# Patient Record
Sex: Female | Born: 1970
Health system: Southern US, Community
[De-identification: ages and names within clinical notes are randomized; demographics above are authoritative.]

## PROBLEM LIST (undated history)

## (undated) DIAGNOSIS — M722 Plantar fascial fibromatosis: Secondary | ICD-10-CM

## (undated) DIAGNOSIS — G4700 Insomnia, unspecified: Secondary | ICD-10-CM

## (undated) DIAGNOSIS — F32A Depression, unspecified: Secondary | ICD-10-CM

## (undated) DIAGNOSIS — R51 Headache: Secondary | ICD-10-CM

## (undated) DIAGNOSIS — IMO0002 Reserved for concepts with insufficient information to code with codable children: Secondary | ICD-10-CM

## (undated) DIAGNOSIS — G43009 Migraine without aura, not intractable, without status migrainosus: Secondary | ICD-10-CM

## (undated) DIAGNOSIS — F329 Major depressive disorder, single episode, unspecified: Secondary | ICD-10-CM

## (undated) DIAGNOSIS — N3281 Overactive bladder: Secondary | ICD-10-CM

## (undated) DIAGNOSIS — M1990 Unspecified osteoarthritis, unspecified site: Secondary | ICD-10-CM

## (undated) DIAGNOSIS — N938 Other specified abnormal uterine and vaginal bleeding: Secondary | ICD-10-CM

## (undated) DIAGNOSIS — R011 Cardiac murmur, unspecified: Secondary | ICD-10-CM

## (undated) DIAGNOSIS — Z803 Family history of malignant neoplasm of breast: Secondary | ICD-10-CM

## (undated) HISTORY — DX: Unspecified osteoarthritis, unspecified site: M19.90

## (undated) HISTORY — DX: Reserved for concepts with insufficient information to code with codable children: IMO0002

## (undated) HISTORY — DX: Major depressive disorder, single episode, unspecified: F32.9

## (undated) HISTORY — DX: Plantar fascial fibromatosis: M72.2

## (undated) HISTORY — PX: LUMBAR DISC SURGERY: SHX700

## (undated) HISTORY — PX: CYSTOCELE REPAIR: SHX163

## (undated) HISTORY — DX: Cardiac murmur, unspecified: R01.1

## (undated) HISTORY — PX: ENDOMETRIAL ABLATION: SHX621

## (undated) HISTORY — DX: Migraine without aura, not intractable, without status migrainosus: G43.009

## (undated) HISTORY — PX: OTHER SURGICAL HISTORY: SHX169

## (undated) HISTORY — DX: Headache: R51

## (undated) HISTORY — DX: Other specified abnormal uterine and vaginal bleeding: N93.8

## (undated) HISTORY — DX: Depression, unspecified: F32.A

## (undated) HISTORY — DX: Insomnia, unspecified: G47.00

## (undated) HISTORY — PX: LEEP: SHX91

## (undated) HISTORY — DX: Overactive bladder: N32.81

## (undated) HISTORY — DX: Family history of malignant neoplasm of breast: Z80.3

---

## 1991-12-29 HISTORY — PX: LEEP: SHX91

## 1998-06-28 ENCOUNTER — Other Ambulatory Visit: Admission: RE | Admit: 1998-06-28 | Discharge: 1998-06-28 | Payer: Self-pay | Admitting: Obstetrics and Gynecology

## 1998-08-26 ENCOUNTER — Observation Stay (HOSPITAL_COMMUNITY): Admission: AD | Admit: 1998-08-26 | Discharge: 1998-08-26 | Payer: Self-pay | Admitting: Obstetrics and Gynecology

## 1999-01-31 ENCOUNTER — Inpatient Hospital Stay (HOSPITAL_COMMUNITY): Admission: AD | Admit: 1999-01-31 | Discharge: 1999-01-31 | Payer: Self-pay | Admitting: Obstetrics and Gynecology

## 1999-02-16 ENCOUNTER — Inpatient Hospital Stay (HOSPITAL_COMMUNITY): Admission: AD | Admit: 1999-02-16 | Discharge: 1999-02-16 | Payer: Self-pay | Admitting: Obstetrics and Gynecology

## 1999-03-03 ENCOUNTER — Inpatient Hospital Stay (HOSPITAL_COMMUNITY): Admission: AD | Admit: 1999-03-03 | Discharge: 1999-03-06 | Payer: Self-pay | Admitting: Obstetrics and Gynecology

## 1999-04-07 ENCOUNTER — Other Ambulatory Visit: Admission: RE | Admit: 1999-04-07 | Discharge: 1999-04-07 | Payer: Self-pay | Admitting: Obstetrics and Gynecology

## 2000-06-21 ENCOUNTER — Other Ambulatory Visit: Admission: RE | Admit: 2000-06-21 | Discharge: 2000-06-21 | Payer: Self-pay | Admitting: Obstetrics and Gynecology

## 2001-06-22 ENCOUNTER — Other Ambulatory Visit: Admission: RE | Admit: 2001-06-22 | Discharge: 2001-06-22 | Payer: Self-pay | Admitting: Obstetrics and Gynecology

## 2001-07-22 ENCOUNTER — Encounter: Admission: RE | Admit: 2001-07-22 | Discharge: 2001-07-22 | Payer: Self-pay | Admitting: Obstetrics and Gynecology

## 2001-07-22 ENCOUNTER — Encounter: Payer: Self-pay | Admitting: Obstetrics and Gynecology

## 2002-01-23 ENCOUNTER — Other Ambulatory Visit: Admission: RE | Admit: 2002-01-23 | Discharge: 2002-01-23 | Payer: Self-pay | Admitting: Obstetrics and Gynecology

## 2002-02-01 ENCOUNTER — Encounter: Payer: Self-pay | Admitting: Obstetrics and Gynecology

## 2002-02-01 ENCOUNTER — Inpatient Hospital Stay (HOSPITAL_COMMUNITY): Admission: AD | Admit: 2002-02-01 | Discharge: 2002-02-01 | Payer: Self-pay | Admitting: Obstetrics and Gynecology

## 2002-07-17 ENCOUNTER — Inpatient Hospital Stay (HOSPITAL_COMMUNITY): Admission: AD | Admit: 2002-07-17 | Discharge: 2002-07-17 | Payer: Self-pay | Admitting: Obstetrics and Gynecology

## 2002-08-16 ENCOUNTER — Inpatient Hospital Stay (HOSPITAL_COMMUNITY): Admission: AD | Admit: 2002-08-16 | Discharge: 2002-08-18 | Payer: Self-pay | Admitting: Obstetrics and Gynecology

## 2002-09-22 ENCOUNTER — Other Ambulatory Visit: Admission: RE | Admit: 2002-09-22 | Discharge: 2002-09-22 | Payer: Self-pay | Admitting: Obstetrics and Gynecology

## 2004-03-17 ENCOUNTER — Other Ambulatory Visit: Admission: RE | Admit: 2004-03-17 | Discharge: 2004-03-17 | Payer: Self-pay | Admitting: Obstetrics and Gynecology

## 2004-04-03 ENCOUNTER — Encounter: Admission: RE | Admit: 2004-04-03 | Discharge: 2004-04-03 | Payer: Self-pay | Admitting: Obstetrics and Gynecology

## 2004-12-18 ENCOUNTER — Ambulatory Visit: Payer: Self-pay | Admitting: Family Medicine

## 2004-12-28 HISTORY — PX: ABDOMINOPLASTY: SUR9

## 2005-04-27 ENCOUNTER — Encounter: Admission: RE | Admit: 2005-04-27 | Discharge: 2005-04-27 | Payer: Self-pay | Admitting: Obstetrics and Gynecology

## 2005-05-05 ENCOUNTER — Ambulatory Visit: Payer: Self-pay | Admitting: Family Medicine

## 2005-10-26 ENCOUNTER — Ambulatory Visit: Payer: Self-pay | Admitting: Internal Medicine

## 2005-10-27 ENCOUNTER — Encounter: Admission: RE | Admit: 2005-10-27 | Discharge: 2005-10-27 | Payer: Self-pay | Admitting: Internal Medicine

## 2005-11-11 ENCOUNTER — Encounter: Admission: RE | Admit: 2005-11-11 | Discharge: 2005-11-11 | Payer: Self-pay | Admitting: Family Medicine

## 2005-11-11 ENCOUNTER — Ambulatory Visit: Payer: Self-pay | Admitting: Family Medicine

## 2005-11-24 ENCOUNTER — Ambulatory Visit: Payer: Self-pay | Admitting: Family Medicine

## 2005-12-04 ENCOUNTER — Encounter: Admission: RE | Admit: 2005-12-04 | Discharge: 2005-12-04 | Payer: Self-pay | Admitting: Sports Medicine

## 2005-12-28 HISTORY — PX: LUMBAR DISC SURGERY: SHX700

## 2006-06-03 ENCOUNTER — Encounter: Admission: RE | Admit: 2006-06-03 | Discharge: 2006-06-03 | Payer: Self-pay | Admitting: Obstetrics and Gynecology

## 2006-10-29 ENCOUNTER — Encounter: Admission: RE | Admit: 2006-10-29 | Discharge: 2006-10-29 | Payer: Self-pay | Admitting: Sports Medicine

## 2006-11-19 ENCOUNTER — Encounter: Admission: RE | Admit: 2006-11-19 | Discharge: 2006-11-19 | Payer: Self-pay | Admitting: Sports Medicine

## 2006-11-29 ENCOUNTER — Ambulatory Visit (HOSPITAL_COMMUNITY): Admission: RE | Admit: 2006-11-29 | Discharge: 2006-11-29 | Payer: Self-pay | Admitting: Neurosurgery

## 2007-03-23 ENCOUNTER — Ambulatory Visit (HOSPITAL_COMMUNITY): Admission: RE | Admit: 2007-03-23 | Discharge: 2007-03-23 | Payer: Self-pay | Admitting: Neurosurgery

## 2007-08-31 ENCOUNTER — Encounter: Admission: RE | Admit: 2007-08-31 | Discharge: 2007-08-31 | Payer: Self-pay | Admitting: Obstetrics and Gynecology

## 2007-10-07 ENCOUNTER — Encounter: Payer: Self-pay | Admitting: Family Medicine

## 2007-11-17 ENCOUNTER — Ambulatory Visit: Payer: Self-pay | Admitting: Family Medicine

## 2007-11-17 DIAGNOSIS — Z8679 Personal history of other diseases of the circulatory system: Secondary | ICD-10-CM

## 2007-11-17 DIAGNOSIS — M791 Myalgia, unspecified site: Secondary | ICD-10-CM | POA: Insufficient documentation

## 2007-11-17 DIAGNOSIS — N3942 Incontinence without sensory awareness: Secondary | ICD-10-CM | POA: Insufficient documentation

## 2007-11-17 DIAGNOSIS — R51 Headache: Secondary | ICD-10-CM

## 2007-11-17 DIAGNOSIS — M1990 Unspecified osteoarthritis, unspecified site: Secondary | ICD-10-CM | POA: Insufficient documentation

## 2007-11-17 DIAGNOSIS — R5600 Simple febrile convulsions: Secondary | ICD-10-CM | POA: Insufficient documentation

## 2007-11-17 DIAGNOSIS — Z9189 Other specified personal risk factors, not elsewhere classified: Secondary | ICD-10-CM

## 2007-11-17 DIAGNOSIS — F329 Major depressive disorder, single episode, unspecified: Secondary | ICD-10-CM

## 2007-11-17 DIAGNOSIS — R519 Headache, unspecified: Secondary | ICD-10-CM | POA: Insufficient documentation

## 2007-11-17 DIAGNOSIS — IMO0001 Reserved for inherently not codable concepts without codable children: Secondary | ICD-10-CM

## 2007-12-07 ENCOUNTER — Ambulatory Visit: Payer: Self-pay | Admitting: Family Medicine

## 2007-12-07 LAB — CONVERTED CEMR LAB
Bilirubin Urine: NEGATIVE
Glucose, Urine, Semiquant: NEGATIVE
Protein, U semiquant: NEGATIVE
pH: 7.5

## 2007-12-13 LAB — CONVERTED CEMR LAB
Albumin: 3.8 g/dL (ref 3.5–5.2)
Alkaline Phosphatase: 67 units/L (ref 39–117)
BUN: 8 mg/dL (ref 6–23)
Basophils Absolute: 0 10*3/uL (ref 0.0–0.1)
Chloride: 100 meq/L (ref 96–112)
Cholesterol: 207 mg/dL (ref 0–200)
Creatinine, Ser: 0.8 mg/dL (ref 0.4–1.2)
GFR calc non Af Amer: 86 mL/min
HDL: 58.2 mg/dL (ref >39.0)
MCHC: 34.9 g/dL (ref 30.0–36.0)
Monocytes Absolute: 0.4 10*3/uL (ref 0.2–0.7)
Monocytes Relative: 5.9 % (ref 3.0–11.0)
Potassium: 4.4 meq/L (ref 3.5–5.1)
RBC: 4.93 M/uL (ref 3.87–5.11)
RDW: 13 % (ref 11.5–14.6)
TSH: 2.09 microintl units/mL (ref 0.35–5.50)
Total Bilirubin: 0.8 mg/dL (ref 0.3–1.2)
Total CHOL/HDL Ratio: 3.6
Triglycerides: 50 mg/dL (ref 0–149)

## 2007-12-14 ENCOUNTER — Ambulatory Visit: Payer: Self-pay | Admitting: Family Medicine

## 2008-02-06 ENCOUNTER — Telehealth: Payer: Self-pay | Admitting: Family Medicine

## 2008-07-20 ENCOUNTER — Telehealth: Payer: Self-pay | Admitting: Family Medicine

## 2008-09-05 ENCOUNTER — Encounter: Admission: RE | Admit: 2008-09-05 | Discharge: 2008-09-05 | Payer: Self-pay | Admitting: Obstetrics and Gynecology

## 2008-09-26 ENCOUNTER — Encounter: Payer: Self-pay | Admitting: Family Medicine

## 2008-10-05 ENCOUNTER — Ambulatory Visit (HOSPITAL_COMMUNITY): Admission: RE | Admit: 2008-10-05 | Discharge: 2008-10-05 | Payer: Self-pay | Admitting: Obstetrics and Gynecology

## 2008-10-05 ENCOUNTER — Encounter (INDEPENDENT_AMBULATORY_CARE_PROVIDER_SITE_OTHER): Payer: Self-pay | Admitting: Obstetrics and Gynecology

## 2008-12-28 HISTORY — PX: ENDOMETRIAL ABLATION: SHX621

## 2009-01-31 ENCOUNTER — Ambulatory Visit: Payer: Self-pay | Admitting: Family Medicine

## 2009-01-31 ENCOUNTER — Telehealth: Payer: Self-pay | Admitting: Family Medicine

## 2009-01-31 DIAGNOSIS — G4700 Insomnia, unspecified: Secondary | ICD-10-CM | POA: Insufficient documentation

## 2009-02-11 ENCOUNTER — Ambulatory Visit: Payer: Self-pay | Admitting: Family Medicine

## 2009-02-11 DIAGNOSIS — L03211 Cellulitis of face: Secondary | ICD-10-CM

## 2009-02-11 DIAGNOSIS — L0201 Cutaneous abscess of face: Secondary | ICD-10-CM

## 2009-02-12 ENCOUNTER — Encounter: Payer: Self-pay | Admitting: Family Medicine

## 2009-04-01 ENCOUNTER — Telehealth: Payer: Self-pay | Admitting: Family Medicine

## 2009-05-21 ENCOUNTER — Emergency Department (HOSPITAL_COMMUNITY): Admission: EM | Admit: 2009-05-21 | Discharge: 2009-05-21 | Payer: Self-pay | Admitting: Family Medicine

## 2009-09-30 ENCOUNTER — Encounter: Admission: RE | Admit: 2009-09-30 | Discharge: 2009-09-30 | Payer: Self-pay | Admitting: Obstetrics and Gynecology

## 2009-10-07 ENCOUNTER — Telehealth: Payer: Self-pay | Admitting: Family Medicine

## 2009-12-07 ENCOUNTER — Encounter (INDEPENDENT_AMBULATORY_CARE_PROVIDER_SITE_OTHER): Payer: Self-pay | Admitting: *Deleted

## 2009-12-10 ENCOUNTER — Ambulatory Visit: Payer: Self-pay | Admitting: Family Medicine

## 2009-12-10 DIAGNOSIS — N390 Urinary tract infection, site not specified: Secondary | ICD-10-CM | POA: Insufficient documentation

## 2009-12-10 LAB — CONVERTED CEMR LAB
Blood in Urine, dipstick: NEGATIVE
Nitrite: POSITIVE
Specific Gravity, Urine: <1.005
pH: 5

## 2009-12-11 ENCOUNTER — Encounter: Payer: Self-pay | Admitting: Family Medicine

## 2009-12-26 ENCOUNTER — Telehealth: Payer: Self-pay | Admitting: Family Medicine

## 2010-02-04 ENCOUNTER — Ambulatory Visit: Payer: Self-pay | Admitting: Family Medicine

## 2010-02-04 DIAGNOSIS — N938 Other specified abnormal uterine and vaginal bleeding: Secondary | ICD-10-CM | POA: Insufficient documentation

## 2010-02-04 DIAGNOSIS — N949 Unspecified condition associated with female genital organs and menstrual cycle: Secondary | ICD-10-CM

## 2010-02-04 LAB — CONVERTED CEMR LAB
Bilirubin Urine: NEGATIVE
Ketones, urine, test strip: NEGATIVE
Specific Gravity, Urine: <1.005
Urobilinogen, UA: 1

## 2010-02-05 ENCOUNTER — Encounter: Payer: Self-pay | Admitting: Family Medicine

## 2010-03-24 ENCOUNTER — Telehealth: Payer: Self-pay | Admitting: Family Medicine

## 2010-07-02 ENCOUNTER — Telehealth: Payer: Self-pay | Admitting: Family Medicine

## 2010-08-21 ENCOUNTER — Ambulatory Visit: Payer: Self-pay | Admitting: Family Medicine

## 2010-08-21 DIAGNOSIS — E669 Obesity, unspecified: Secondary | ICD-10-CM | POA: Insufficient documentation

## 2010-08-29 ENCOUNTER — Telehealth (INDEPENDENT_AMBULATORY_CARE_PROVIDER_SITE_OTHER): Payer: Self-pay

## 2010-10-21 ENCOUNTER — Encounter: Admission: RE | Admit: 2010-10-21 | Discharge: 2010-10-21 | Payer: Self-pay | Admitting: Obstetrics and Gynecology

## 2010-10-27 ENCOUNTER — Telehealth: Payer: Self-pay | Admitting: Family Medicine

## 2010-12-18 ENCOUNTER — Telehealth: Payer: Self-pay | Admitting: Internal Medicine

## 2010-12-18 ENCOUNTER — Encounter: Payer: Self-pay | Admitting: Family Medicine

## 2010-12-19 ENCOUNTER — Telehealth: Payer: Self-pay | Admitting: Family Medicine

## 2011-01-18 ENCOUNTER — Encounter: Payer: Self-pay | Admitting: Neurosurgery

## 2011-01-19 ENCOUNTER — Ambulatory Visit
Admission: RE | Admit: 2011-01-19 | Discharge: 2011-01-19 | Payer: Self-pay | Source: Home / Self Care | Attending: Family Medicine | Admitting: Family Medicine

## 2011-01-19 DIAGNOSIS — J069 Acute upper respiratory infection, unspecified: Secondary | ICD-10-CM | POA: Insufficient documentation

## 2011-01-19 DIAGNOSIS — K140 Glossitis: Secondary | ICD-10-CM | POA: Insufficient documentation

## 2011-01-27 NOTE — Progress Notes (Signed)
Summary: [facility] [place]. Pt req refill of Tramadol  Phone Note From Pharmacy Call back at [phone]  [facility]   Caller: [facility]   [place] [place] Summary of Call: Pt req refill of Tramadol 50mg  #120 to be sent to [facility] on [address]  fax# [phone]   Initial call taken by: Lucy Antigua,  July 02, 2010 11:10 AM    Prescriptions: TRAMADOL HCL 50 MG  TABS (TRAMADOL HCL) 1 or 2 tabs every 6 hours as needed pain  #120 Each x 0   Entered by:   Raechel Ache, RN   Authorized by:   Nelwyn Salisbury MD   Signed by:   Raechel Ache, RN on 07/02/2010   Method used:   Electronically to        [facility] [address] (retail)       [address]       [place], [place]  [address]       Ph: [phone]       Fax: [phone]   RxID:   [id number]

## 2011-01-27 NOTE — Assessment & Plan Note (Signed)
Summary: UTI? // RS   Vital Signs:  Patient profile:   40 year old female Weight:      188 pounds Temp:     98.2 degrees F oral Pulse rate:   89 / minute BP sitting:   110 / 80  Vitals Entered By: Lynann Beaver CMA (February 04, 2010 3:22 PM) CC: uti? Is Patient Diabetic? No Pain Assessment Patient in pain? yes     Location: abdomen   History of Present Illness: Here with another UTI, and she asks advice. She has a hx of frequent UTIs. Now for 2 days sha has the typical burning on urination and urgency to urinate. No fevers. Also, she has a long hx of dysfunctional uterine bleeding, and she has seen Dr. Billy Coast for this for years. Nothing thye have done has helped. this includes taking BCP, Provera cycles, wearing an IUD, wearing a NUVA ring, and even a laser endometrial ablation. She still has constant pelvic cramps with very unpredictable bleeding. She has to wear a pad of some sort most every day. She wants to know what else to do. She says she is through having children, and is open to the idea of having a hysterectomy. However Dr. Billy Coast does not seem to favor this.   Current Medications (verified): 1)  Glucosamine-Chondroitin 250-200 Mg  Caps (Glucosamine-Chondroitin) .Marland Kitchen.. 1 By Mouth Two Times A Day 2)  Vitamin D 400 Unit  Tabs (Cholecalciferol) .Marland Kitchen.. 1 By Mouth Once Daily 3)  Cymbalta 60 Mg  Cpep (Duloxetine Hcl) .... Once Daily 4)  Tramadol Hcl 50 Mg  Tabs (Tramadol Hcl) .Marland Kitchen.. 1 or 2 Tabs Every 6 Hours As Needed Pain 5)  Vesicare 10 Mg  Tabs (Solifenacin Succinate) .... Once Daily 6)  Ambien 10 Mg Tabs (Zolpidem Tartrate) .Marland Kitchen.. 1 By Mouth At Bedtime 7)  Bactroban 2 % Oint (Mupirocin) .... Apply 4 Times A Day As Needed 8)  Fluconazole 150 Mg Tabs (Fluconazole) .... As Needed 9)  Polytrim 10000-0.1 Unit/ml-% Soln (Polymyxin B-Trimethoprim) .... 2 Drops Qid Each Eye  Allergies (verified): 1)  ! Phenergan  Past History:  Past Medical History: Plantar  Fasciitis Osteoarthritis Headache heart murmur herniated lumbar discs, per Dr. Phoebe Perch Depression overactive bladder, sees Dr. Retta Diones insomnia sees Dr. Olivia Mackie for GYN exams dysfunctional uterine bleeding  Past Surgical History: LEEP procedure 1993 Lumbar discectomy  per Dr. Phoebe Perch 12-07 endometrial ablation per Dr. Billy Coast 2010  Review of Systems  The patient denies anorexia, fever, weight loss, weight gain, vision loss, decreased hearing, hoarseness, chest pain, syncope, dyspnea on exertion, peripheral edema, prolonged cough, headaches, hemoptysis, melena, hematochezia, severe indigestion/heartburn, hematuria, incontinence, genital sores, muscle weakness, suspicious skin lesions, transient blindness, difficulty walking, depression, unusual weight change, abnormal bleeding, enlarged lymph nodes, angioedema, breast masses, and testicular masses.    Physical Exam  General:  Well-developed,well-nourished,in no acute distress; alert,appropriate and cooperative throughout examination Abdomen:  soft, normal bowel sounds, no distention, no masses, no guarding, no rigidity, no rebound tenderness, and no abdominal hernia.  Mild bilateral lower quadrant tenderness.    Impression & Recommendations:  Problem # 1:  UTI (ICD-599.0)  Her updated medication list for this problem includes:    Vesicare 10 Mg Tabs (Solifenacin succinate) ..... Once daily    Ciprofloxacin Hcl 500 Mg Tabs (Ciprofloxacin hcl) .Marland Kitchen..Marland Kitchen Two times a day  Orders: UA Dipstick w/o Micro (automated)  (81003) T-Culture, Urine ([id number])  Problem # 2:  DYSFUNCTIONAL UTERINE BLEEDING (ICD-626.8)  Orders: Gynecologic Referral (Gyn)  Complete  Medication List: 1)  Glucosamine-chondroitin 250-200 Mg Caps (Glucosamine-chondroitin) .Marland Kitchen.. 1 by mouth two times a day 2)  Vitamin D 400 Unit Tabs (Cholecalciferol) .Marland Kitchen.. 1 by mouth once daily 3)  Cymbalta 60 Mg Cpep (Duloxetine hcl) .... Once daily 4)  Tramadol Hcl 50 Mg  Tabs (Tramadol hcl) .Marland Kitchen.. 1 or 2 tabs every 6 hours as needed pain 5)  Vesicare 10 Mg Tabs (Solifenacin succinate) .... Once daily 6)  Ambien 10 Mg Tabs (Zolpidem tartrate) .Marland Kitchen.. 1 by mouth at bedtime 7)  Bactroban 2 % Oint (Mupirocin) .... Apply 4 times a day as needed 8)  Fluconazole 150 Mg Tabs (Fluconazole) .... As needed 9)  Polytrim 10000-0.1 Unit/ml-% Soln (Polymyxin b-trimethoprim) .... 2 drops qid each eye 10)  Ciprofloxacin Hcl 500 Mg Tabs (Ciprofloxacin hcl) .... Two times a day 11)  Valtrex 500 Mg Tabs (Valacyclovir hcl) .... Two times a day as needed  Patient Instructions: 1)  use Cipro for the UTI, and await the culture results. It seems to me that a hysterectomy is the only remaining option for her to deal with the uterine bleeding, and she is willing to pursue this. We will refer her to Dr. Waynard Reeds for a second opinion on this matter.  Prescriptions: VALTREX 500 MG TABS (VALACYCLOVIR HCL) two times a day as needed  #60 x 11   Entered and Authorized by:   Nelwyn Salisbury MD   Signed by:   Nelwyn Salisbury MD on 02/04/2010   Method used:   Electronically to        [facility] [address] [phone]* (retail)       [address]       [place], [place]  [address]       Ph: [phone] or [phone]       Fax: [phone]   RxID:   [id number] CIPROFLOXACIN HCL 500 MG TABS (CIPROFLOXACIN HCL) two times a day  #20 x 0   Entered and Authorized by:   Nelwyn Salisbury MD   Signed by:   Nelwyn Salisbury MD on 02/04/2010   Method used:   Electronically to        [facility] [address] [phone]* (retail)       [address]       [place], [place]  [address]       Ph: [phone] or [phone]       Fax: [phone]   RxID:   [id number]   Laboratory Results   Urine Tests    Routine Urinalysis   Color: orange Appearance: Clear Glucose: trace   (Normal Range: Negative) Bilirubin: negative   (Normal Range: Negative) Ketone: negative    (Normal Range: Negative) Spec. Gravity: <1.005   (Normal Range: 1.003-1.035) Blood: 2+   (Normal Range: Negative) pH: 5.0   (Normal Range: 5.0-8.0) Protein: trace   (Normal Range: Negative) Urobilinogen: 1.0   (Normal Range: 0-1) Nitrite: positive   (Normal Range: Negative) Leukocyte Esterace: trace   (Normal Range: Negative)    Comments: Rita Ohara  February 04, 2010 4:30 PM

## 2011-01-27 NOTE — Assessment & Plan Note (Signed)
Summary: fup on meds//ccm   Vital Signs:  Patient profile:   40 year old female Weight:      188 pounds BMI:     30.00 BP sitting:   126 / 84  (left arm) Cuff size:   regular  Vitals Entered By: Raechel Ache, RN (August 21, 2010 11:03 AM) CC: Med check.   History of Present Illness: Here for med refills and to ask for assistance with losing weight. She is trying to exercise, but she cannot control her appetite. She has used Phentermine in the past with success. She uses Tramadol to control her arthritic pains.   Allergies: 1)  ! Phenergan  Past History:  Past Medical History: Reviewed history from 02/04/2010 and no changes required. Plantar Fasciitis Osteoarthritis Headache heart murmur herniated lumbar discs, per Dr. Phoebe Perch Depression overactive bladder, sees Dr. Retta Diones insomnia sees Dr. Olivia Mackie for GYN exams dysfunctional uterine bleeding  Past Surgical History: Reviewed history from 02/04/2010 and no changes required. LEEP procedure 1993 Lumbar discectomy  per Dr. Phoebe Perch 12-07 endometrial ablation per Dr. Billy Coast 2010  Review of Systems  The patient denies anorexia, fever, weight loss, vision loss, decreased hearing, hoarseness, chest pain, syncope, dyspnea on exertion, peripheral edema, prolonged cough, headaches, hemoptysis, abdominal pain, melena, hematochezia, severe indigestion/heartburn, hematuria, incontinence, genital sores, muscle weakness, suspicious skin lesions, transient blindness, difficulty walking, depression, unusual weight change, abnormal bleeding, enlarged lymph nodes, angioedema, breast masses, and testicular masses.    Physical Exam  General:  overweight-appearing.   Neck:  No deformities, masses, or tenderness noted. Lungs:  Normal respiratory effort, chest expands symmetrically. Lungs are clear to auscultation, no crackles or wheezes. Heart:  Normal rate and regular rhythm. S1 and S2 normal without gallop, murmur, click, rub or  other extra sounds.   Impression & Recommendations:  Problem # 1:  DEPRESSION, CHRONIC (ICD-311)  Her updated medication list for this problem includes:    Cymbalta 60 Mg Cpep (Duloxetine hcl) ..... Once daily  Problem # 2:  MYALGIA (ICD-729.1)  Her updated medication list for this problem includes:    Tramadol Hcl 50 Mg Tabs (Tramadol hcl) .Marland Kitchen... 1 or 2 tabs every 6 hours as needed pain  Problem # 3:  HEADACHE (ICD-784.0)  Her updated medication list for this problem includes:    Tramadol Hcl 50 Mg Tabs (Tramadol hcl) .Marland Kitchen... 1 or 2 tabs every 6 hours as needed pain  Problem # 4:  OSTEOARTHRITIS (ICD-715.90)  Her updated medication list for this problem includes:    Tramadol Hcl 50 Mg Tabs (Tramadol hcl) .Marland Kitchen... 1 or 2 tabs every 6 hours as needed pain  Problem # 5:  OBESITY (ICD-278.00)  Complete Medication List: 1)  Glucosamine-chondroitin 250-200 Mg Caps (Glucosamine-chondroitin) .Marland Kitchen.. 1 by mouth two times a day 2)  Vitamin D 400 Unit Tabs (Cholecalciferol) .Marland Kitchen.. 1 by mouth once daily 3)  Cymbalta 60 Mg Cpep (Duloxetine hcl) .... Once daily 4)  Tramadol Hcl 50 Mg Tabs (Tramadol hcl) .Marland Kitchen.. 1 or 2 tabs every 6 hours as needed pain 5)  Vesicare 10 Mg Tabs (Solifenacin succinate) .... Once daily 6)  Ambien 10 Mg Tabs (Zolpidem tartrate) .Marland Kitchen.. 1 by mouth at bedtime 7)  Bactroban 2 % Oint (Mupirocin) .... Apply 4 times a day as needed 8)  Fluconazole 150 Mg Tabs (Fluconazole) .... As needed 9)  Polytrim 10000-0.1 Unit/ml-% Soln (Polymyxin b-trimethoprim) .... 2 drops qid each eye 10)  Valtrex 500 Mg Tabs (Valacyclovir hcl) .... Two times a day  as needed 11)  Phentermine Hcl 37.5 Mg Caps (Phentermine hcl) .... Once daily  Patient Instructions: 1)  Try Phentermine. refilled meds.  2)  Please schedule a follow-up appointment in 6 months .  Prescriptions: PHENTERMINE HCL 37.5 MG CAPS (PHENTERMINE HCL) once daily  #30 x 5   Entered and Authorized by:   Nelwyn Salisbury MD   Signed by:    Nelwyn Salisbury MD on 08/21/2010   Method used:   Print then Give to Patient   RxID:   [id number] TRAMADOL HCL 50 MG  TABS (TRAMADOL HCL) 1 or 2 tabs every 6 hours as needed pain  #120 x 5   Entered and Authorized by:   Nelwyn Salisbury MD   Signed by:   Nelwyn Salisbury MD on 08/21/2010   Method used:   Print then Give to Patient   RxID:   [id number]

## 2011-01-27 NOTE — Progress Notes (Signed)
Summary: ANTIBIOTIC  Phone Note Call from Patient Call back at [phone]   Caller: vm at 4:44 Summary of Call: Requesting antibiotic CIPRO for UTI SYMPTOMS  to [facility] [phone]  nkda.   Initial call taken by: Rudy Jew, RN,  October 27, 2010 4:53 PM  Follow-up for Phone Call        call in Cipro 500 mg two times a day for 7 days  Follow-up by: Nelwyn Salisbury MD,  October 28, 2010 8:31 AM  Additional Follow-up for Phone Call Additional follow up Details #1::        pt aware.  Additional Follow-up by: Pura Spice, RN,  October 28, 2010 8:37 AM    New/Updated Medications: CIPRO 500 MG TABS (CIPROFLOXACIN HCL) 1 by mouth two times a day for 7 days Prescriptions: CIPRO 500 MG TABS (CIPROFLOXACIN HCL) 1 by mouth two times a day for 7 days  #14 x 0   Entered by:   Pura Spice, RN   Authorized by:   Nelwyn Salisbury MD   Signed by:   Pura Spice, RN on 10/28/2010   Method used:   Electronically to        [facility] [address] [phone]* (retail)       [address]       [place], [place]  [address]       Ph: [phone] or [phone]       Fax: [phone]   RxID:   [id number]

## 2011-01-27 NOTE — Progress Notes (Signed)
Summary: Pt req to get a copy of immunizations record  Phone Note Call from Patient Call back at [phone] cell   Caller: Patient Summary of Call: Pt called and has started a new job. Pt is needing to get a copy of her immunizations record. Pls call pt when ready for pick up.  Pt needs this asap.  Initial call taken by: Lucy Antigua,  August 29, 2010 8:07 AM  Follow-up for Phone Call        called. Follow-up by: Raechel Ache, RN,  August 29, 2010 11:18 AM

## 2011-01-27 NOTE — Progress Notes (Signed)
Summary: REQ FOR REFILL ON MED  Phone Note Call from Patient   Caller: Patient  [phone] Reason for Call: Refill Medication Summary of Call: Pt called to adv that she needs a refill on med: VESICARE 10MG  .... Pt adv that Rx can be sent to [facility] on [address]..... Pt can be reached at [phone] with any questions or concerns.  Initial call taken by: Debbra Riding,  March 24, 2010 10:08 AM  Follow-up for Phone Call        call in #30 with 11 rf Follow-up by: Nelwyn Salisbury MD,  March 24, 2010 3:05 PM    Prescriptions: VESICARE 10 MG  TABS (SOLIFENACIN SUCCINATE) once daily  #30.0 Each x 11   Entered by:   Raechel Ache, RN   Authorized by:   Nelwyn Salisbury MD   Signed by:   Raechel Ache, RN on 03/24/2010   Method used:   Electronically to        [facility]. [address] [phone]* (retail)       [address]       [place], [place]  [address]       Ph: [phone] or [phone]       Fax: [phone]   RxID:   [id number]

## 2011-01-29 NOTE — Progress Notes (Signed)
Summary: Pt has uti. Pt req uti called in to [facility] on [address]  Phone Note Call from Patient Call back at [phone]   Caller: Patient Summary of Call: Pt called and says she has a uti. Pt req antibiotic to be called in to [facility] on [address] [phone]. Pls call pt and notify her when this has been called in today.   Initial call taken by: Lucy Antigua,  December 18, 2010 1:23 PM  Follow-up for Phone Call        generic cipro 500 #14 one BID Follow-up by: Gordy Savers  MD,  December 18, 2010 5:12 PM    New/Updated Medications: CIPRO 500 MG TABS (CIPROFLOXACIN HCL) 1 by mouth two times a day for 7 days Prescriptions: CIPRO 500 MG TABS (CIPROFLOXACIN HCL) 1 by mouth two times a day for 7 days  #14 x 0   Entered by:   Lynann Beaver CMA AAMA   Authorized by:   Gordy Savers  MD   Signed by:   Lynann Beaver CMA AAMA on 12/19/2010   Method used:   Electronically to        [facility] (retail)       [address]       [place], [place]  [address]       Ph: [phone]       Fax: [phone]   RxID:   [id number]  Pt. notified.

## 2011-01-29 NOTE — Progress Notes (Signed)
  Phone Note Call from Patient   Summary of Call: cipro sent to wrong pharmacy Initial call taken by: Willy Eddy, LPN,  December 19, 2010 2:35 PM    Prescriptions: CIPRO 500 MG TABS (CIPROFLOXACIN HCL) 1 by mouth two times a day for 7 days  #14 x 0   Entered by:   Willy Eddy, LPN   Authorized by:   Nelwyn Salisbury MD   Signed by:   Willy Eddy, LPN on 04/54/0981   Method used:   Electronically to        [facility] [address] [phone]* (retail)       [address]       [place], [place]  [address]       Ph: [phone] or [phone]       Fax: [phone]   RxID:   [id number]

## 2011-01-29 NOTE — Letter (Signed)
Summary: [facility]  [facility]   Imported By: Maryln Gottron 12/31/2010 13:50:03  _____________________________________________________________________  External Attachment:    Type:   Image     Comment:   External Document

## 2011-01-29 NOTE — Assessment & Plan Note (Signed)
Summary: 2 CYSTS ON TONGUE//SLM   Vital Signs:  Patient profile:   40 year old female Weight:      187 pounds O2 Sat:      96 % Temp:     98.3 degrees F BP sitting:   100 / 60  (left arm)  Vitals Entered By: Pura Spice, RN (January 19, 2011 10:21 AM) CC: c/o lumps on tongue refill phetermine and tramadol. wants something for migraines. , Back Pain   History of Present Illness: Here for several reasons. First one week ago she developed some URI symptoms including a stuffy head, PND, ST, and tender swellings along both edges of her tongue. No cough or fever. Now these symptoms have resolved except for the tongue swellings, and these are much smaller than before. Second, she needs med refills. Her migrianes have been more frequent over the past few months, and she is interested in trying Initrex. Apparently she has never tried a triptan before.    Allergies: 1)  ! Phenergan  Past History:  Past Medical History: Reviewed history from 02/04/2010 and no changes required. Plantar Fasciitis Osteoarthritis Headache heart murmur herniated lumbar discs, per Dr. Phoebe Perch Depression overactive bladder, sees Dr. Retta Diones insomnia sees Dr. Olivia Mackie for GYN exams dysfunctional uterine bleeding  Review of Systems  The patient denies anorexia, fever, weight loss, weight gain, vision loss, decreased hearing, hoarseness, chest pain, syncope, dyspnea on exertion, peripheral edema, prolonged cough, hemoptysis, abdominal pain, melena, hematochezia, severe indigestion/heartburn, hematuria, incontinence, genital sores, muscle weakness, suspicious skin lesions, transient blindness, difficulty walking, depression, unusual weight change, abnormal bleeding, enlarged lymph nodes, angioedema, breast masses, and testicular masses.    Physical Exam  General:  Well-developed,well-nourished,in no acute distress; alert,appropriate and cooperative throughout examination Head:  Normocephalic and  atraumatic without obvious abnormalities. No apparent alopecia or balding. Eyes:  No corneal or conjunctival inflammation noted. EOMI. Perrla. Funduscopic exam benign, without hemorrhages, exudates or papilledema. Vision grossly normal. Ears:  External ear exam shows no significant lesions or deformities.  Otoscopic examination reveals clear canals, tympanic membranes are intact bilaterally without bulging, retraction, inflammation or discharge. Hearing is grossly normal bilaterally. Nose:  External nasal examination shows no deformity or inflammation. Nasal mucosa are pink and moist without lesions or exudates. Mouth:  Oral mucosa and oropharynx without lesions or exudates.  Teeth in good repair. Neck:  No deformities, masses, or tenderness noted. Lungs:  Normal respiratory effort, chest expands symmetrically. Lungs are clear to auscultation, no crackles or wheezes. Neurologic:  No cranial nerve deficits noted. Station and gait are normal. Plantar reflexes are down-going bilaterally. DTRs are symmetrical throughout. Sensory, motor and coordinative functions appear intact.   Impression & Recommendations:  Problem # 1:  VIRAL URI (ICD-465.9)  Problem # 2:  GLOSSITIS (ICD-529.0)  Problem # 3:  HEADACHE (ICD-784.0)  Her updated medication list for this problem includes:    Tramadol Hcl 50 Mg Tabs (Tramadol hcl) .Marland Kitchen... 1 or 2 tabs every 6 hours as needed pain    Sumatriptan Succinate 100 Mg Tabs (Sumatriptan succinate) .Marland Kitchen... As needed for ha  Complete Medication List: 1)  Glucosamine-chondroitin 250-200 Mg Caps (Glucosamine-chondroitin) .Marland Kitchen.. 1 by mouth two times a day 2)  Vitamin D 400 Unit Tabs (Cholecalciferol) .Marland Kitchen.. 1 by mouth once daily 3)  Cymbalta 60 Mg Cpep (Duloxetine hcl) .... Once daily 4)  Tramadol Hcl 50 Mg Tabs (Tramadol hcl) .Marland Kitchen.. 1 or 2 tabs every 6 hours as needed pain 5)  Vesicare 10 Mg Tabs (Solifenacin  succinate) .... Once daily 6)  Ambien 10 Mg Tabs (Zolpidem tartrate) .Marland Kitchen.. 1  by mouth at bedtime 7)  Bactroban 2 % Oint (Mupirocin) .... Apply 4 times a day as needed 8)  Fluconazole 150 Mg Tabs (Fluconazole) .... As needed 9)  Polytrim 10000-0.1 Unit/ml-% Soln (Polymyxin b-trimethoprim) .... 2 drops qid each eye 10)  Valtrex 500 Mg Tabs (Valacyclovir hcl) .... Two times a day as needed 11)  Phentermine Hcl 37.5 Mg Caps (Phentermine hcl) .... Once daily 12)  Sumatriptan Succinate 100 Mg Tabs (Sumatriptan succinate) .... As needed for ha  Patient Instructions: 1)  the tongue irritation seems to be part of the viral infection, and all of this is resolving. No treatment needed today. Try Imitrex for the HAs.  Prescriptions: SUMATRIPTAN SUCCINATE 100 MG TABS (SUMATRIPTAN SUCCINATE) as needed for HA  #10 x 11   Entered and Authorized by:   Nelwyn Salisbury MD   Signed by:   Nelwyn Salisbury MD on 01/19/2011   Method used:   Print then Give to Patient   RxID:   [id number] TRAMADOL HCL 50 MG  TABS (TRAMADOL HCL) 1 or 2 tabs every 6 hours as needed pain  #120 x 5   Entered and Authorized by:   Nelwyn Salisbury MD   Signed by:   Nelwyn Salisbury MD on 01/19/2011   Method used:   Print then Give to Patient   RxID:   [id number]    Orders Added: 1)  Est. Patient Level IV [27253]

## 2011-02-26 ENCOUNTER — Other Ambulatory Visit: Payer: Self-pay | Admitting: Family Medicine

## 2011-04-28 ENCOUNTER — Other Ambulatory Visit: Payer: Self-pay | Admitting: Surgery

## 2011-05-12 NOTE — Op Note (Signed)
Stacy Hays, Stacy Hays                ACCOUNT NO.:  [id number]   MEDICAL RECORD NO.:  [id number]          PATIENT TYPE:  AMB   LOCATION:  SDC                           FACILITY:  [facility]   PHYSICIAN:  Bertram Millard. Dahlstedt, M.D.DATE OF BIRTH:  06/23/1971   DATE OF PROCEDURE:  10/05/2008  DATE OF DISCHARGE:                               OPERATIVE REPORT   PREOPERATIVE DIAGNOSIS:  Stress urinary incontinence.   POSTOPERATIVE DIAGNOSIS:  Stress urinary incontinence.   PROCEDURE:  Lynx retropubic sling.   SURGEON:  Bertram Millard. Dahlstedt, MD   ANESTHESIA:  General with LMA.   COMPLICATIONS:  None.   BRIEF HISTORY:  This 40 year old female was sent recently by Dr. Billy Coast  for consideration of an anti-incontinence procedure.  She is having a  thermal ablation of her endometrium, and has a long history of stress  urinary incontinence.  In questioning her, she has mostly stress  incontinence with some mild remaining overactive bladder symptoms,  managed on Vesicare.  She desires surgical management of her stress  incontinence.  Risks and complications, as well as alternatives of the  surgery were discussed with the patient.  She understands these and  desires to proceed.   DESCRIPTION OF PROCEDURE:  The patient was administered preoperative  antibiotics, and taken to the operating room where Dr. Billy Coast, after  induction of anesthetic, performed endometrial ablation and removal of  an IUD.  The patient had been left in the dorsal lithotomy position, and  had already been prepped and draped.  At this point, a time-out was  called for the sling procedure.  Following this, 2 puncture sites were  made in the suprapubic area just above the pubis on either side of the  midline.  Additionally, her anterior vaginal wall was infiltrated with  10 mL of 1% lidocaine with epinephrine.  Her bladder was catheterized  and drained.  A midline incision was then made along the length of most  of the urethra,  and lateral dissection carried out to raise the vaginal  flaps to the pubocervical fascia bilaterally.  The retropubic needles  were then passed through the respective incisions in the suprapubic  area, guided down through the space of Retzius and exited the body on  either side of the urethra through the vaginal incision.  Following  this, careful inspection of the bladder was performed with the  cystoscope.  No bladder injuries were seen.  The [place] sling was then  grasped and brought through the retropubic space, cradling the urethra.  Approximately, one-half to one cm of slack was left on the sling,  encompassing the urethra.  Once adequate positioning was seen, the  sheath was removed.  The sling was trimmed underneath the incisions  bilaterally in the suprapubic area and pushed down into the subcutaneous  tissues.  Dermabond was used to close these incisions.  The vaginal  incision was then closed with a running 2-0 Vicryl in a simple fashion.  There was no significant bleeding.  The catheter was used to drain all, but about 100-150 mL of the water  out of  the bladder.  The left remained indwelling for a voiding trial  later.   The patient tolerated the procedure well.  She was then awakened and  taken to PACU in stable condition.      Bertram Millard. Dahlstedt, M.D.  Electronically Signed     SMD/MEDQ  D:  10/05/2008  T:  10/05/2008  Job:  [id number]   cc:   Lenoard Aden, M.D.  Fax: [phone]

## 2011-05-12 NOTE — Op Note (Signed)
Stacy Hays, Stacy Hays                ACCOUNT NO.:  [id number]   MEDICAL RECORD NO.:  [id number]          PATIENT TYPE:  AMB   LOCATION:  SDC                           [facility]ILITY[facility]  [facility]   PHYSICIAN:  Lenoard Aden, M.D.DATE OF BIRTH:  Dec 09, 1971   DATE OF PROCEDURE:  10/05/2008  DATE OF DISCHARGE:                               OPERATIVE REPORT   PREOPERATIVE DIAGNOSES:  Menorrhagia, contraceptive management.   POSTOPERATIVE DIAGNOSES:  Menorrhagia, contraceptive management.   PROCEDURES:  Diagnostic hysteroscopy, dilation and curettage, NovaSure  endometrial ablation, Merina intrauterine device removal.   SURGEON:  Lenoard Aden, MD   ANESTHESIA:  General.   BLOOD LOSS:  Less than 50 mL.   COMPLICATIONS:  None.   DRAINS:  None.   COUNTS:  Correct.   The patient prepared for a urologic sling procedure per Dr. Retta Diones to  be dictated separately.   BRIEF OPERATIVE NOTE:  After being apprised of risks of anesthesia,  infection, bleeding, injury to abdominal organs, need for repair,  delayed versus immediate complications due to uterine perforation with  possible need for repair.  The patient was brought to the operating room  where she was administered general anesthetic without complications,  prepped and draped in the usual sterile fashion.  Catheterized until the  bladder was emptied.  After achieving adequate anesthesia, dilute  Marcaine solution placed, paracervical block 20 mL total of dilute  crescent solution placed at 3 and 9 o'clock cervicovaginal junction.  Cervix was grasped in the anterior lip using a single-tooth tenaculum  and IUD string was visualized, grasped and removed intact without  complication.  Normal intact IUD was noted.  Minimal bleeding.  At this  time, cervix was easily dilated up to #21 [facility] dilator.  Hysteroscope  placed.  Visualization revealed thickened endometrium, but no evidence  of structural defects.  D&C with sharp endometrial  curettage in a four-  quadrant method was performed without difficulty.  Good hemostasis  noted.  Hysteroscope was replaced.  Visualization revealed consistently  normal endometrial cavity.  NovaSure device was set to a length of 5 and  a width upon proper seating of 4.3.  The CO2 test was negative.  NovaSure procedure was  initiated and lasted 89 seconds.  NovaSure was removed intact.  Revisualization revealed a well ablated endometrial cavity without  evidence of uterine perforation.  Good hemostasis noted.  All  instruments removed.  The patient tolerated procedure well and is  awaiting followup.      Lenoard Aden, M.D.  Electronically Signed     RJT/MEDQ  D:  10/05/2008  T:  10/05/2008  Job:  [id number]   cc:   Bertram Millard. Dahlstedt, M.D.  Fax: [phone]

## 2011-05-15 NOTE — H&P (Signed)
   NAMENEIDA, ELLEGOOD                          ACCOUNT NO.:  [id number]   MEDICAL RECORD NO.:  [id number]                   PATIENT TYPE:  INP   LOCATION:  9168                                 [facility]ACILITY:  [facility]   PHYSICIAN:  Lenoard Aden, M.D.             DATE OF BIRTH:  1971-10-01   DATE OF ADMISSION:  08/16/2002  DATE OF DISCHARGE:                                HISTORY & PHYSICAL   CHIEF COMPLAINT:  Worsening sciatica, for induction.   HISTORY OF PRESENT ILLNESS:  The patient is a 40 year old white female, G3,  P2; EDD at August 31, 2002, at 38 weeks.  Reports worsening sciatica and  lower abdominal pain; for induction.   ALLERGIES:  PHENERGAN.   MEDICATIONS:  Prenatal vitamin.   PAST MEDICAL HISTORY:  1. History of spontaneous vaginal delivery, 1992 and in 2000.  2. Previous history of LEEP x2 in 1993.   FAMILY HISTORY:  Insulin-dependent diabetes, heart disease, febrile  seizures.   PHYSICAL EXAMINATION:  GENERAL:  She is a well developed, well nourished  white female, in no apparent distress.  HEENT:  Normal.  LUNGS:  Clear.  HEART:  Regular rate and rhythm.  ABDOMEN:  Soft, gravid and nontender.  GENITOURINARY:  Cervix is 2-3 cm, 50%, vertex and -1.  EXTREMITIES:  No cords.  NEUROLOGIC:  Nonfocal.   IMPRESSION:  38-week intrauterine pregnancy; worsening sciatica with  favorable cervix for induction.  Plan to proceed with Pitocin augmentation,  artificial rupture of membranes and induction of labor.                                                Lenoard Aden, M.D.    RJT/MEDQ  D:  08/16/2002  T:  08/16/2002  Job:  [id number]   cc:   [facility]

## 2011-05-15 NOTE — H&P (Signed)
[facility]  Patient:    Stacy Hays, Stacy Hays Visit Number: [id number] MRN: [id number]          Service Type: OBS Location: [facility] Attending Physician:  Lenoard Aden Dictated by:   Lenoard Aden, M.D. Admit Date:  02/01/2002                           History and Physical  CHIEF COMPLAINT:              Refractory hyperemesis.  HISTORY OF PRESENT ILLNESS:   Patient is a 40 year old white female G3, P2 at [redacted] weeks gestation with refractory hyperemesis, nausea, vomiting.  PAST MEDICAL HISTORY:         Remarkable for childhood seizures, history of vaginal delivery x2, history of LEEP.  MEDICATIONS:                  Zofran with increased fatigue noted.  ALLERGIES:                    PHENERGAN.  FAMILY HISTORY:               Noncontributory.  SOCIAL HISTORY:               Noncontributory.  Patient works as a Engineer, civil (consulting) in [employer].  PHYSICAL EXAMINATION  VITAL SIGNS:                  Blood pressure 126/73, respirations 18, pulse 70, temperature 98.3.  HEENT:                        Normal.  LUNGS:                        Clear.  HEART:                        Regular rate and rhythm.  ABDOMEN:                      Soft, scaphoid, nontender.  PELVIC:                       Confirms 8-10 week intrauterine pregnancy with uterine enlargement.  No adnexal masses.  Perineum:  Intact.  Vulva:  Normal.  EXTREMITIES:                  No cords.  NEUROLOGIC:                   Nonfocal.  No CVA tenderness is noted.  IMPRESSION:                   1. A 10 week intrauterine pregnancy.                               2. Refractory hyperemesis.  Rule out molar                                  pregnancy versus fetal viability.  PLAN:                         IV fluids.  Reglan pump.  Pelvic ultrasound to confirm  viability.  Discharge pending p.o. intake and establishment of Reglan pump. Dictated by:   Lenoard Aden, M.D. Attending Physician:  Lenoard Aden DD:  02/01/02 TD:  02/02/02 Job: [id number]

## 2011-05-15 NOTE — Op Note (Signed)
Stacy Hays, Stacy Hays                ACCOUNT NO.:  [id number]   MEDICAL RECORD NO.:  [id number]          PATIENT TYPE:  AMB   LOCATION:  SDS                          FACILITY:  [facility]   PHYSICIAN:  Clydene Fake, M.D.  DATE OF BIRTH:  30-Mar-1971   DATE OF PROCEDURE:  11/29/2006  DATE OF DISCHARGE:                               OPERATIVE REPORT   PREOPERATIVE DIAGNOSIS:  Herniated nucleus pulposus, right L5-S1.   POSTOPERATIVE DIAGNOSIS:  Herniated nucleus pulposus, right L5-S1.   PROCEDURE:  Right L5-S1 semihemilaminectomy and diskectomy,  microdissection with a microscope.   SURGEON:  Clydene Fake, M.D.   ASSISTANT:  Cristi Loron, M.D.   ANESTHESIA:  General endotracheal tube anesthesia.   ESTIMATED BLOOD LOSS:  Minimal; blood given, none.   COMPLICATIONS:  None.   DRAINS:  None.   REASON FOR PROCEDURE:  The patient is a 40 year old, left-handed woman,  who has been having back and right leg pain, worse for some time.  Had  some epidural steroid injections and that has helped symptoms for some 9  months.  Though, starting in September, she started having more back  pain, severe right leg pain in the S1 distribution.  MRI was done,  showing disk herniation, right-sided 5-1, with strength intact, straight  leg raise positive.  The patient was brought for decompression and  diskectomy of the right 5-1 area.   PROCEDURE IN DETAIL:  The patient was brought to the operating room.  General anesthesia was induced.  The patient was placed in prone  position on a Wilson frame with all pressure points padded.  The patient  was scrubbed and prepped and draped in the usual sterile fashion.  The  site of the incision was injected with 10 cc of 1% lidocaine with  epinephrine.  A needle was placed in the lower lumbar spine in the  midline and x-rays were obtained showing the needle was pointing at the  5-1 disk space, but just above the 5 spinous process.  An incision was  then made centered below where the needle was, and the incision was  taken down to the fascia and hemostasis was obtained with Bovie  cauterization.  The fascia was incised on the right, and on the right  side a subperiosteal dissection was done over the L5 and S1 spinous  processes and laminae out to the facet.  A self-retaining retractor was  placed.  A marker was placed in the interspace and another x-ray was  obtained, confirming our positioning at the 5-1 interspace.  A high-  speed drill was used to start a semihemilaminectomy.  The microscope was  brought in for microdissection at this point, and Kerrison punches were  used to continue the laminectomy, medial facetectomy and foraminotomy  over the S1 root.  Ligamentum flavum was removed with a Kerrison punch.  We then explored the epidural space down to the disk space and  subligamentous disk herniation was seen.  We were able to enter the very  thin membrane over the disk and we took out a few large pieces of  disk  fragment.  The disk space was fairly flat at this point.  We explored  the area, but we found a large hole right into the disk space with more  disk material sitting there.  Diskectomy was then continued within the  disk space using curets and pituitary rongeurs to continue removing  disk.  When we were finished, we had a good decompression of the central  canal and the S1 and L5 roots.  Hemostasis was obtained with Gelfoam and  thrombin and this was then irrigated out.  We irrigated with antibiotic  solution and we gained hemostasis.  We had good decompression of the S1  root, and the retractors were removed.  The fascia was closed with 0  Vicryl interrupted sutures.  The subcutaneous tissue was closed with 0,  2-0 and 3-0 Vicryl interrupted suture.  The skin was closed with benzoin  and Steri-Strips.  A dressing was placed.  The patient was placed in a  supine position, awakened from anesthesia and transferred to the   recovery room in stable condition.           ______________________________  Clydene Fake, M.D.     JRH/MEDQ  D:  11/29/2006  T:  11/29/2006  Job:  [id number]   cc:   Cristi Loron, M.D.

## 2011-05-27 ENCOUNTER — Ambulatory Visit (INDEPENDENT_AMBULATORY_CARE_PROVIDER_SITE_OTHER): Payer: 59 | Admitting: Family Medicine

## 2011-05-27 ENCOUNTER — Encounter: Payer: Self-pay | Admitting: Family Medicine

## 2011-05-27 VITALS — BP 102/70 | HR 80 | Temp 98.6°F | Ht 66.5 in | Wt 199.0 lb

## 2011-05-27 DIAGNOSIS — M549 Dorsalgia, unspecified: Secondary | ICD-10-CM

## 2011-05-27 DIAGNOSIS — F32A Depression, unspecified: Secondary | ICD-10-CM

## 2011-05-27 DIAGNOSIS — F329 Major depressive disorder, single episode, unspecified: Secondary | ICD-10-CM

## 2011-05-27 DIAGNOSIS — F3289 Other specified depressive episodes: Secondary | ICD-10-CM

## 2011-05-27 DIAGNOSIS — R635 Abnormal weight gain: Secondary | ICD-10-CM

## 2011-05-27 LAB — CBC WITH DIFFERENTIAL/PLATELET
Basophils Relative: 0.6 % (ref 0.0–3.0)
Eosinophils Absolute: 0.3 10*3/uL (ref 0.0–0.7)
Eosinophils Relative: 4 % (ref 0.0–5.0)
Hemoglobin: 14.4 g/dL (ref 12.0–15.0)
Lymphocytes Relative: 28.6 % (ref 12.0–46.0)
MCHC: 34.7 g/dL (ref 30.0–36.0)
MCV: 89.5 fl (ref 78.0–100.0)
Neutro Abs: 4.8 10*3/uL (ref 1.4–7.7)
RBC: 4.63 Mil/uL (ref 3.87–5.11)
WBC: 7.8 10*3/uL (ref 4.5–10.5)

## 2011-05-27 LAB — LIPID PANEL
HDL: 56.8 mg/dL (ref >39.00)
LDL Cholesterol: 84 mg/dL (ref 0–99)
Total CHOL/HDL Ratio: 3
VLDL: 29 mg/dL (ref 0.0–40.0)

## 2011-05-27 LAB — BASIC METABOLIC PANEL
Calcium: 9.3 mg/dL (ref 8.4–10.5)
Creatinine, Ser: 0.8 mg/dL (ref 0.4–1.2)
GFR: 86.84 mL/min (ref >60.00)
Sodium: 135 mEq/L (ref 135–145)

## 2011-05-27 LAB — HEPATIC FUNCTION PANEL
Albumin: 3.8 g/dL (ref 3.5–5.2)
Alkaline Phosphatase: 63 U/L (ref 39–117)
Bilirubin, Direct: 0.1 mg/dL (ref 0.0–0.3)

## 2011-05-27 MED ORDER — DULOXETINE HCL 30 MG PO CPEP: 30.0000 mg | ORAL_CAPSULE | Freq: Every day | ORAL | Status: DC

## 2011-05-27 MED ORDER — BUPROPION HCL ER (XL) 150 MG PO TB24: 150.0000 mg | ORAL_TABLET | ORAL | Status: DC

## 2011-05-27 NOTE — Progress Notes (Signed)
  Subjective:    Patient ID: Stacy Hays, female    DOB: 23-Mar-1971, 40 y.o.   MRN: [id number]  HPI Here to discuss her meds and recent weight gain. She is eating a very healthy diet and she exercises almost every day, yet she still gains weight. She has put on 12 lbs since she was here last January. She has been on Cymbalta for several years, and we know this has a tendency to make people gain weight. Her depression and anxiety are better controlled, but she still thinks she needs to be on something for this. Her back pain is much better, and she takes only one Tramadol a day.    Review of Systems  Constitutional: Positive for unexpected weight change.  Respiratory: Negative.   Cardiovascular: Negative.   Musculoskeletal: Negative.   Psychiatric/Behavioral: Positive for dysphoric mood. The patient is nervous/anxious.        Objective:   Physical Exam  Constitutional: She appears well-developed and well-nourished.  Neck: No thyromegaly present.  Cardiovascular: Normal rate, regular rhythm, normal heart sounds and intact distal pulses.   Pulmonary/Chest: Effort normal and breath sounds normal.  Lymphadenopathy:    She has no cervical adenopathy.  Psychiatric: She has a normal mood and affect. Her behavior is normal. Thought content normal.          Assessment & Plan:  I do think part of the weight problem is side effects from Cymbalta, so we will taper off this. She will take 30 mg samples daily for 2 weeks and then stop it. Concurrently she will start on Wellbutrin. Get labs today. Recheck in one month

## 2011-05-29 ENCOUNTER — Telehealth: Payer: Self-pay | Admitting: Family Medicine

## 2011-05-29 NOTE — Telephone Encounter (Signed)
Spoke with patient and she is aware of lab results 

## 2011-05-29 NOTE — Telephone Encounter (Signed)
Pt called req lab results. Pls call asap.   °

## 2011-06-02 NOTE — Progress Notes (Signed)
Call placed to patient at [phone], no answer. A voice message was left informing patient of normal lab results

## 2011-06-23 ENCOUNTER — Encounter: Payer: Self-pay | Admitting: Family Medicine

## 2011-06-23 ENCOUNTER — Ambulatory Visit (INDEPENDENT_AMBULATORY_CARE_PROVIDER_SITE_OTHER): Payer: 59 | Admitting: Family Medicine

## 2011-06-23 VITALS — BP 112/80 | HR 79 | Temp 97.9°F | Resp 14 | Wt 198.0 lb

## 2011-06-23 DIAGNOSIS — F3289 Other specified depressive episodes: Secondary | ICD-10-CM

## 2011-06-23 DIAGNOSIS — F329 Major depressive disorder, single episode, unspecified: Secondary | ICD-10-CM

## 2011-06-23 DIAGNOSIS — F32A Depression, unspecified: Secondary | ICD-10-CM

## 2011-06-23 MED ORDER — BUPROPION HCL ER (XL) 300 MG PO TB24: 300.0000 mg | ORAL_TABLET | ORAL | Status: DC

## 2011-06-23 NOTE — Progress Notes (Signed)
  Subjective:    Patient ID: Stacy Hays, female    DOB: 07-11-1971, 40 y.o.   MRN: [id number]  HPI Here to follow up on anxiety and depression. One month ago we started a slow wean off Cymbalta and started on Wellbutrin XL. She likes this much better, and in fact she has lost a pound of weight since then. She feels like she needs a higher dose of wellbutrin however. She is still depressed, and she spent a lot of time today telling me about some marital problems she has been having. Her husband is not supportive and she does not know what to do about it. She tried to go to marital counseling with him, but he refused to cooperate.    Review of Systems  Constitutional: Negative.   Psychiatric/Behavioral: Positive for dysphoric mood. The patient is nervous/anxious.        Objective:   Physical Exam  Constitutional: She appears well-developed and well-nourished.  Psychiatric:       Tearful, depressed. Good eye contact          Assessment & Plan:  Increase the Wellbutrin to 300 mg a day. I strongly advised her to get some individual therapy and gave her info about the [place] program. Recheck in one month. We spent 45 minutes talking, 100% of which was spent face to face.

## 2011-07-02 ENCOUNTER — Other Ambulatory Visit: Payer: Self-pay | Admitting: Family Medicine

## 2011-07-02 NOTE — Telephone Encounter (Signed)
LOV 06/23/11

## 2011-07-02 NOTE — Telephone Encounter (Signed)
Call in #[phone] with 5 rf 

## 2011-07-02 NOTE — Telephone Encounter (Signed)
rx sent to pharmacy

## 2011-07-12 ENCOUNTER — Emergency Department (HOSPITAL_BASED_OUTPATIENT_CLINIC_OR_DEPARTMENT_OTHER)
Admission: EM | Admit: 2011-07-12 | Discharge: 2011-07-13 | Disposition: A | Discharge status: Home / Self Care | Payer: 59 | Attending: Emergency Medicine | Admitting: Emergency Medicine

## 2011-07-12 ENCOUNTER — Encounter (HOSPITAL_BASED_OUTPATIENT_CLINIC_OR_DEPARTMENT_OTHER): Payer: Self-pay | Admitting: *Deleted

## 2011-07-12 DIAGNOSIS — R51 Headache: Secondary | ICD-10-CM

## 2011-07-12 DIAGNOSIS — G43909 Migraine, unspecified, not intractable, without status migrainosus: Secondary | ICD-10-CM | POA: Insufficient documentation

## 2011-07-12 DIAGNOSIS — Z8739 Personal history of other diseases of the musculoskeletal system and connective tissue: Secondary | ICD-10-CM | POA: Insufficient documentation

## 2011-07-12 MED ORDER — SODIUM CHLORIDE 0.9 % IV SOLN
Freq: Once | INTRAVENOUS | Status: AC
  Administered 2011-07-12: 22:00:00 via INTRAVENOUS

## 2011-07-12 MED ORDER — KETOROLAC TROMETHAMINE 30 MG/ML IJ SOLN
30.0000 mg | Freq: Once | INTRAMUSCULAR | Status: AC
  Administered 2011-07-12: 30 mg via INTRAVENOUS
  Filled 2011-07-12: qty 1

## 2011-07-12 MED ORDER — DEXAMETHASONE SODIUM PHOSPHATE 10 MG/ML IJ SOLN
10.0000 mg | Freq: Once | INTRAMUSCULAR | Status: AC
  Administered 2011-07-12: 10 mg via INTRAVENOUS
  Filled 2011-07-12: qty 1

## 2011-07-12 MED ORDER — ONDANSETRON HCL 4 MG/2ML IJ SOLN
4.0000 mg | Freq: Once | INTRAMUSCULAR | Status: AC
  Administered 2011-07-12: 4 mg via INTRAVENOUS
  Filled 2011-07-12: qty 2

## 2011-07-12 NOTE — ED Notes (Signed)
Pt woke this am with migraine pt has taken her Imitrex and ibuprofen with no relief

## 2011-07-12 NOTE — ED Provider Notes (Signed)
History     Patient reports waking up with a migraine that has gradually worsen thru out the day. Reports migraine is located across her forehead.  Described as throbbing.  Reports similar symptoms in past.  Unable to control with home imitrex or ibuprofen.   Patient is a 40 y.o. female presenting with migraine.  Migraine This is a new problem. The current episode started today. The problem occurs constantly. The problem has been gradually worsening. Associated symptoms include headaches and nausea. Pertinent negatives include no chills, congestion, coughing, fever, neck pain, numbness, rash, sore throat, vertigo, visual change, vomiting or weakness. Exacerbated by: Sherlynn [place]. She has tried NSAIDs (imitrex) for the symptoms. The treatment provided no relief.  Migraine This is a new problem. The current episode started today. The problem occurs constantly. The problem has been gradually worsening. Associated symptoms include headaches. Exacerbated by: Sherlynn [place]. She has tried NSAIDs (imitrex) for the symptoms. The treatment provided no relief.    Past Medical History  Diagnosis Date  . Plantar fasciitis   . Arthritis   . Headache   . Heart murmur   . Herniated disc   . Depression   . Overactive bladder   . Insomnia   . Dysfunctional uterine bleeding     Past Surgical History  Procedure Date  . Leep   . Lumbar disc surgery   . Endometrial ablation     Family History  Problem Relation Age of Onset  . Diabetes    . Hyperlipidemia    . Hypertension    . Kidney disease    . Breast cancer    . Prostate cancer    . Sudden death    . Coronary artery disease      History  Substance Use Topics  . Smoking status: Never Smoker   . Smokeless tobacco: Not on file  . Alcohol Use: No   Allergies  Allergen Reactions  . Promethazine Hcl     REACTION: Dystonic reaction   Prior to Admission medications   Medication Sig Start Date End Date Taking? Authorizing Provider    buPROPion (WELLBUTRIN XL) 300 MG 24 hr tablet Take 1 tablet (300 mg total) by mouth every morning. 06/23/11 06/22/12 Yes Nelwyn Salisbury  Misc Natural Products (GLUCOS-CHONDROIT-MSM COMPLEX PO) Take 1 tablet by mouth daily.     Yes Historical Provider, MD  SUMAtriptan (IMITREX) 100 MG tablet Take 100 mg by mouth every 2 (two) hours as needed. For migraine   Yes Historical Provider, MD  traMADol (ULTRAM) 50 MG tablet Take 100 mg by mouth daily as needed. For pain    Yes Historical Provider, MD  traMADol (ULTRAM) 50 MG tablet   07/02/11  Yes Nelwyn Salisbury  VESICARE 10 MG tablet TAKE ONE TABLET BY MOUTH DAILY 02/26/11  Yes Nelwyn Salisbury  vitamin C (ASCORBIC ACID) 500 MG tablet Take 500 mg by mouth daily.     Yes Historical Provider, MD  Vitamin D, Cholecalciferol, 400 UNITS CHEW Chew 400 Units by mouth daily.     Yes Historical Provider, MD  VITAMIN D, CHOLECALCIFEROL, PO Take 1 capsule by mouth daily.     Yes Historical Provider, MD  DULoxetine (CYMBALTA) 30 MG capsule Take 1 capsule (30 mg total) by mouth daily. 07/14/11 07/13/12  Carolynn Serve, NP  ondansetron (ZOFRAN) 8 MG tablet Take 1 tablet (8 mg total) by mouth every 8 (eight) hours as needed for nausea. 07/14/11 07/21/11  Carolynn Serve, NP  SUMAtriptan-naproxen (TREXIMET) 85-500 MG per tablet Take 1 tablet by mouth every 2 (two) hours as needed for migraine. 07/14/11 07/13/12  Carolynn Serve, NP  zolpidem (AMBIEN) 10 MG tablet Take 10 mg by mouth at bedtime as needed. For sleep    Historical Provider, MD     OB History    Grav Para Term Preterm Abortions TAB SAB Ect Mult Living                  Review of Systems  Constitutional: Negative for fever and chills.  HENT: Negative for ear pain, congestion, sore throat, neck pain, neck stiffness and sinus pressure.   Eyes: Positive for photophobia. Negative for pain and redness.  Respiratory: Negative for cough.   Gastrointestinal: Positive for nausea. Negative for vomiting.   Skin: Negative for rash.  Neurological: Positive for headaches. Negative for dizziness, vertigo, seizures, speech difficulty, weakness, light-headedness and numbness.  All other systems reviewed and are negative.    Physical Exam  BP 143/96  Pulse 76  Temp(Src) 98.3 F (36.8 C) (Oral)  Resp 22  SpO2 98%  Physical Exam  Vitals reviewed. Constitutional: She is oriented to person, place, and time. She appears well-developed and well-nourished.  HENT:  Head: Normocephalic and atraumatic.  Right Ear: Tympanic membrane, external ear and ear canal normal.  Left Ear: Tympanic membrane, external ear and ear canal normal.  Nose: Nose normal.  Mouth/Throat: Uvula is midline, oropharynx is clear and moist and mucous membranes are normal.  Eyes: Conjunctivae and EOM are normal. Pupils are equal, round, and reactive to light.  Neck: Normal range of motion. Neck supple.  Cardiovascular: Normal rate, regular rhythm and normal heart sounds.  Exam reveals no friction rub.   No murmur heard. Pulmonary/Chest: Effort normal and breath sounds normal. She has no wheezes. She has no rales. She exhibits no tenderness.  Abdominal: Soft. Bowel sounds are normal. She exhibits no distension and no mass. There is no tenderness. There is no rebound and no guarding.  Musculoskeletal: Normal range of motion.  Neurological: She is alert and oriented to person, place, and time. She has normal strength. No sensory deficit. Coordination and gait normal. GCS eye subscore is 4. GCS verbal subscore is 5. GCS motor subscore is 6.       Negative menigeal signs.  Full ROM of neck.  Non tender c spine  Skin: Skin is warm and dry. No rash noted. No erythema. No pallor.    ED Course  Procedures  MDM  Reports headache has resolved and requests d/c  Medical screening examination/treatment/procedure(s) were performed by non-physician practitioner and as supervising physician I was immediately available for  consultation/collaboration.   Thomasene Lot, [place] 07/15/11 1301  Shelda Jakes, MD 08/14/11 [phone]

## 2011-07-14 ENCOUNTER — Ambulatory Visit: Payer: 59 | Admitting: Licensed Clinical Social Worker

## 2011-07-14 ENCOUNTER — Encounter: Payer: Self-pay | Admitting: Nurse Practitioner

## 2011-07-14 ENCOUNTER — Ambulatory Visit (INDEPENDENT_AMBULATORY_CARE_PROVIDER_SITE_OTHER): Payer: 59 | Admitting: Nurse Practitioner

## 2011-07-14 DIAGNOSIS — G43109 Migraine with aura, not intractable, without status migrainosus: Secondary | ICD-10-CM

## 2011-07-14 MED ORDER — ONDANSETRON HCL 8 MG PO TABS: 8.0000 mg | ORAL_TABLET | Freq: Three times a day (TID) | ORAL | Status: AC | PRN

## 2011-07-14 MED ORDER — DULOXETINE HCL 30 MG PO CPEP: 30.0000 mg | ORAL_CAPSULE | Freq: Every day | ORAL | Status: DC

## 2011-07-14 MED ORDER — SUMATRIPTAN-NAPROXEN SODIUM 85-500 MG PO TABS: 1.0000 | ORAL_TABLET | ORAL | Status: DC | PRN

## 2011-07-14 NOTE — Progress Notes (Signed)
  Subjective:    Patient ID: Stacy Hays, female    DOB: April 07, 1971, 40 y.o.   MRN: [id number]  HPI Comments: New  Headache patient today. Has had migraine without aura since teenager. Located in [place].  In one month 1 severe 1 moderate 1-2 milds. Had severe last Sunday which ended in ER visit. Was very late in taking Imitrex. Took at six ^pm. Did not repeat. Was given in ER toradol, zofran and decadron which was helpful, headache not completely gone. In last 6 weeks has switched from cymbalta 60mg  to wellbutrin 300mg  secondary to weight gain. Has some   " emotional issues" does not feel she has depression. Feels her triggers are alcohol, too much or too little sleep, stress. Is taking BCPs for irregular bleeding after ablation. Married with children, works as Engineer, civil (consulting).   Headache  Associated symptoms include nausea and vomiting.      Review of Systems  Constitutional: Positive for unexpected weight change.  Gastrointestinal: Positive for nausea and vomiting.  Neurological: Positive for headaches.       Objective:   Physical Exam  Constitutional: She is oriented to person, place, and time. She appears well-developed and well-nourished.  HENT:  Head: Normocephalic.  Cardiovascular: Normal rate.   Pulmonary/Chest: Breath sounds normal.  Neurological: She is alert and oriented to person, place, and time. No cranial nerve deficit. Coordination normal.  Skin: Skin is warm and dry.          Assessment & Plan:  A: Migraine without aura  P; Add zofran 8 mg prn Will add back cymbalta 30mg  and have her decrease dose of wellbutrin to 150mg  Education on migraine  treximet 3 samples given RTC  6 weeks

## 2011-07-28 ENCOUNTER — Encounter: Payer: 59 | Admitting: Nurse Practitioner

## 2011-08-25 ENCOUNTER — Ambulatory Visit (INDEPENDENT_AMBULATORY_CARE_PROVIDER_SITE_OTHER): Payer: 59 | Admitting: Nurse Practitioner

## 2011-08-25 ENCOUNTER — Encounter: Payer: Self-pay | Admitting: Nurse Practitioner

## 2011-08-25 DIAGNOSIS — G43109 Migraine with aura, not intractable, without status migrainosus: Secondary | ICD-10-CM

## 2011-08-25 MED ORDER — ALPRAZOLAM 0.25 MG PO TABS: 0.2500 mg | ORAL_TABLET | Freq: Three times a day (TID) | ORAL | Status: AC | PRN

## 2011-08-25 NOTE — Progress Notes (Signed)
  Subjective:    Patient ID: Stacy Hays, female    DOB: 1971-10-15, 40 y.o.   MRN: [id number]  HPI Last seen in July. Since then has decreased dose of wellbutrin to 150mg   without any difficulties. Hx seizures from childhood were febrile seizures. [place]. Developed lightheadedness, dizzy feelings. REstarted meds asap. Mild migraine with other symptoms. Otherwise doing well. Would like to stop wellbutrin. Going to weight watcher and has lost 10lbs.     Review of Systems  Constitutional: Negative for activity change and appetite change.  Eyes: Positive for visual disturbance.  Respiratory: Negative.   Cardiovascular: Negative.   Genitourinary: Positive for menstrual problem.  Neurological: Positive for headaches.       Objective:   Physical Exam  Constitutional: She is oriented to person, place, and time. She appears well-developed and well-nourished. No distress.  HENT:  Head: Normocephalic and atraumatic.  Eyes: Pupils are equal, round, and reactive to light.  Neck: Normal range of motion.  Musculoskeletal: Normal range of motion. She exhibits no edema and no tenderness.  Neurological: She is alert and oriented to person, place, and time. Coordination normal.  Skin: Skin is warm and dry. She is not diaphoretic.  Psychiatric: She has a normal mood and affect. Her behavior is normal. Judgment and thought content normal.          Assessment & Plan:  A: migraine without aura Medication withdrawal  P: Ok to discontinue wellbutrin Xanax 0.25 mg 1/2 tabs BID to TID prn few days for accidental medication withdrawal rtc 6 months or prn

## 2011-09-16 ENCOUNTER — Other Ambulatory Visit: Payer: Self-pay | Admitting: Obstetrics and Gynecology

## 2011-09-16 DIAGNOSIS — Z1231 Encounter for screening mammogram for malignant neoplasm of breast: Secondary | ICD-10-CM

## 2011-09-28 LAB — CBC
MCV: 89.1
Platelets: 273
WBC: 7.2

## 2011-09-28 LAB — HCG, SERUM, QUALITATIVE: Preg, Serum: NEGATIVE

## 2011-10-12 ENCOUNTER — Other Ambulatory Visit: Payer: Self-pay | Admitting: Obstetrics and Gynecology

## 2011-10-27 ENCOUNTER — Ambulatory Visit: Payer: 59

## 2011-11-08 ENCOUNTER — Other Ambulatory Visit: Payer: Self-pay | Admitting: Family Medicine

## 2011-11-17 ENCOUNTER — Ambulatory Visit
Admission: RE | Admit: 2011-11-17 | Discharge: 2011-11-17 | Disposition: A | Discharge status: Home / Self Care | Payer: 59 | Source: Ambulatory Visit | Attending: Obstetrics and Gynecology | Admitting: Obstetrics and Gynecology

## 2011-11-17 DIAGNOSIS — Z1231 Encounter for screening mammogram for malignant neoplasm of breast: Secondary | ICD-10-CM

## 2011-11-24 ENCOUNTER — Other Ambulatory Visit: Payer: Self-pay | Admitting: Obstetrics and Gynecology

## 2011-11-24 DIAGNOSIS — R928 Other abnormal and inconclusive findings on diagnostic imaging of breast: Secondary | ICD-10-CM

## 2011-11-27 ENCOUNTER — Ambulatory Visit
Admission: RE | Admit: 2011-11-27 | Discharge: 2011-11-27 | Disposition: A | Discharge status: Home / Self Care | Payer: 59 | Source: Ambulatory Visit | Attending: Obstetrics and Gynecology | Admitting: Obstetrics and Gynecology

## 2011-11-27 DIAGNOSIS — R928 Other abnormal and inconclusive findings on diagnostic imaging of breast: Secondary | ICD-10-CM

## 2011-12-29 HISTORY — PX: BREAST BIOPSY: SHX20

## 2011-12-31 ENCOUNTER — Encounter: Payer: Self-pay | Admitting: Family

## 2011-12-31 ENCOUNTER — Ambulatory Visit (INDEPENDENT_AMBULATORY_CARE_PROVIDER_SITE_OTHER): Payer: 59 | Admitting: Family

## 2011-12-31 VITALS — BP 108/80 | HR 50 | Temp 98.4°F | Wt 194.0 lb

## 2011-12-31 DIAGNOSIS — J209 Acute bronchitis, unspecified: Secondary | ICD-10-CM

## 2011-12-31 DIAGNOSIS — R059 Cough, unspecified: Secondary | ICD-10-CM

## 2011-12-31 DIAGNOSIS — R05 Cough: Secondary | ICD-10-CM

## 2011-12-31 MED ORDER — PREDNISONE 20 MG PO TABS: ORAL_TABLET | ORAL | Status: AC

## 2011-12-31 MED ORDER — MONTELUKAST SODIUM 10 MG PO TABS: 10.0000 mg | ORAL_TABLET | Freq: Every day | ORAL | Status: DC

## 2011-12-31 NOTE — Patient Instructions (Signed)

## 2011-12-31 NOTE — Progress Notes (Signed)
Subjective:    Patient ID: Stacy Hays, female    DOB: [birth year], 41 y.o.   MRN: [id number]  HPI 41 year old white female, nonsmoker, patient of Dr. Clent Ridges his in today with complaints of chest congestion, fatigue, sore throat and fever; on since December 20. She was originally treated at urgent care December 24 diagnosed with bronchitis given Augmentin twice a day and prednisone 60 mg for 5 days. On prednisone she felt much better shortly after her symptoms relapsed, and congestion return. She was seen again last night at the urgent care and was instructed to go to the ER. She did not because she does not believe her symptoms are cardiac related. EKG was normal. She denies any history of allergies or reactive airway disease. Patient denies any lightheadedness, dizziness, chest pain, nausea, vomiting or diaphoresis.   Review of Systems  Constitutional: Positive for fever and fatigue.  HENT: Negative.   Eyes: Negative.   Respiratory: Positive for cough and wheezing.   Cardiovascular: Negative.   Neurological: Negative.   Hematological: Negative.    Past Medical History  Diagnosis Date  . Plantar fasciitis   . Arthritis   . Headache   . Heart murmur   . Herniated disc   . Depression   . Overactive bladder   . Insomnia   . Dysfunctional uterine bleeding     History   Social History  . Marital Status: Married    Spouse Name: N/A    Number of Children: N/A  . Years of Education: N/A   Occupational History  . Not on file.   Social History Main Topics  . Smoking status: Never Smoker   . Smokeless tobacco: Not on file  . Alcohol Use: Yes     SOCIALLY  . Drug Use: No  . Sexually Active: Yes -- Female partner(s)   Other Topics Concern  . Not on file   Social History Narrative  . No narrative on file    Past Surgical History  Procedure Date  . Leep   . Lumbar disc surgery   . Endometrial ablation   . Abdominal plasty     Family History  Problem Relation Age of  Onset  . Diabetes Mother 31  . Hyperlipidemia Mother 51  . Hypertension Mother   . Breast cancer Mother 38  . Heart disease Mother   . Cancer Mother   . Kidney disease    . Prostate cancer    . Sudden death    . Coronary artery disease    . Heart disease Maternal Grandfather   . Birth defects Paternal Grandmother   . Heart disease Paternal Grandfather   . Diabetes Paternal Grandfather     Allergies  Allergen Reactions  . Promethazine Hcl     REACTION: Dystonic reaction    Current Outpatient Prescriptions on File Prior to Visit  Medication Sig Dispense Refill  . DULoxetine (CYMBALTA) 30 MG capsule Take 1 capsule (30 mg total) by mouth daily.  30 capsule  11  . Misc Natural Products (GLUCOS-CHONDROIT-MSM COMPLEX PO) Take 1 tablet by mouth daily.        . ondansetron (ZOFRAN) 8 MG tablet       . SUMAtriptan (IMITREX) 100 MG tablet Take 100 mg by mouth every 2 (two) hours as needed. For migraine      . traMADol (ULTRAM) 50 MG tablet Take 100 mg by mouth daily as needed. For pain       . VESICARE 10 MG tablet  TAKE ONE TABLET BY MOUTH DAILY  30 tablet  7  . vitamin C (ASCORBIC ACID) 500 MG tablet Take 500 mg by mouth daily.        . Vitamin D, Cholecalciferol, 400 UNITS CHEW Chew 400 Units by mouth daily.        Marland Kitchen VITAMIN D, CHOLECALCIFEROL, PO Take 1 capsule by mouth daily.        Marland Kitchen zolpidem (AMBIEN) 10 MG tablet Take 10 mg by mouth at bedtime as needed. For sleep      . buPROPion (WELLBUTRIN XL) 300 MG 24 hr tablet Take 150 mg by mouth every morning.       . CRYSELLE-28 0.3-30 MG-MCG tablet       . CYMBALTA 60 MG capsule TAKE ONE CAPSULE BY MOUTH DAILY  30 each  11  . SUMAtriptan-naproxen (TREXIMET) 85-500 MG per tablet Take 1 tablet by mouth every 2 (two) hours as needed for migraine.  3 tablet  0  . traMADol (ULTRAM) 50 MG tablet          BP 108/80  Pulse 50  Temp(Src) 98.4 F (36.9 C) (Oral)  Wt 194 lb (87.998 kg)chart    Objective:   Physical Exam  Constitutional:  She appears well-developed and well-nourished.  HENT:  Right Ear: External ear normal.  Left Ear: External ear normal.  Nose: Nose normal.  Mouth/Throat: Oropharynx is clear and moist.  Neck: Normal range of motion.  Cardiovascular: Normal rate, regular rhythm and normal heart sounds.   Pulmonary/Chest: Effort normal.       Coarse breath sounds noted, but good air movement.  Musculoskeletal: Normal range of motion.  Neurological: She is alert.  Skin: Skin is warm and dry.  Psychiatric: She has a normal mood and affect.          Assessment & Plan:  Assessment: Acute bronchitis, cough  Plan: I believe patient has an underlying allergy triggering bronchitis. She denies any new environmental changes. Today we will put her on a 9 day course of prednisone and taper it off. Singulair 10 mg once daily for reactive airway and allergic rhinitis. Patient to call if symptoms worsen or persist recheck schedule and when necessary.

## 2012-01-08 ENCOUNTER — Other Ambulatory Visit: Payer: Self-pay | Admitting: Obstetrics and Gynecology

## 2012-01-08 DIAGNOSIS — N644 Mastodynia: Secondary | ICD-10-CM

## 2012-01-10 ENCOUNTER — Other Ambulatory Visit: Payer: Self-pay | Admitting: Family Medicine

## 2012-01-11 ENCOUNTER — Encounter: Payer: Self-pay | Admitting: Family Medicine

## 2012-01-11 ENCOUNTER — Ambulatory Visit (INDEPENDENT_AMBULATORY_CARE_PROVIDER_SITE_OTHER): Payer: 59 | Admitting: Family Medicine

## 2012-01-11 VITALS — BP 140/90 | HR 94 | Temp 98.5°F | Wt 196.0 lb

## 2012-01-11 DIAGNOSIS — R071 Chest pain on breathing: Secondary | ICD-10-CM

## 2012-01-11 DIAGNOSIS — R0789 Other chest pain: Secondary | ICD-10-CM

## 2012-01-11 DIAGNOSIS — J40 Bronchitis, not specified as acute or chronic: Secondary | ICD-10-CM

## 2012-01-11 MED ORDER — PHENTERMINE HCL 37.5 MG PO CAPS: 37.5000 mg | ORAL_CAPSULE | ORAL | Status: DC

## 2012-01-11 NOTE — Progress Notes (Signed)
  Subjective:    Patient ID: Stacy Hays, female    DOB: [birth year], 41 y.o.   MRN: [id number]  HPI Here to recheck a bronchitis and to check a sharp pain she has had in the right axilla for 3 days. She took a course of Augmentin several weeks ago and several courses of steroids. She is now feeling much better. She has an occasional dry cough but nothing like before. She is working. Then she felt a sharp pain in the right axilla after a sneeze, and it has been bothering her since then. She has a strong family hx of breast cancer, and she has had some abnormalities in her breasts as well. She is waiting to have a 6 month follow up mammogram for these.    Review of Systems  Constitutional: Negative.   HENT: Negative.   Eyes: Negative.   Respiratory: Positive for cough. Negative for chest tightness, shortness of breath and wheezing.   Cardiovascular: Positive for chest pain. Negative for palpitations and leg swelling.       Objective:   Physical Exam  Constitutional: She appears well-developed and well-nourished.  Neck: No thyromegaly present.  Cardiovascular: Normal rate, regular rhythm, normal heart sounds and intact distal pulses.   Pulmonary/Chest: Effort normal. No respiratory distress. She has no wheezes. She has no rales.       Tender on the right chest wall just below the axilla, no masses   Lymphadenopathy:    She has no cervical adenopathy.          Assessment & Plan:  Her bronchitis has resolved. The pain she is feeling is musculoskeletal, and not related to her breast at all. I reassured her about this. She probably tore a muscle from coughing. Thi swill resolve over time. Recheck prn

## 2012-01-14 ENCOUNTER — Other Ambulatory Visit: Payer: Self-pay | Admitting: Obstetrics and Gynecology

## 2012-01-14 ENCOUNTER — Ambulatory Visit
Admission: RE | Admit: 2012-01-14 | Discharge: 2012-01-14 | Disposition: A | Discharge status: Home / Self Care | Payer: 59 | Source: Ambulatory Visit | Attending: Obstetrics and Gynecology | Admitting: Obstetrics and Gynecology

## 2012-01-14 DIAGNOSIS — N644 Mastodynia: Secondary | ICD-10-CM

## 2012-02-08 ENCOUNTER — Other Ambulatory Visit: Payer: Self-pay | Admitting: Family Medicine

## 2012-04-11 ENCOUNTER — Other Ambulatory Visit: Payer: Self-pay | Admitting: Obstetrics and Gynecology

## 2012-04-11 DIAGNOSIS — R921 Mammographic calcification found on diagnostic imaging of breast: Secondary | ICD-10-CM

## 2012-05-11 ENCOUNTER — Other Ambulatory Visit: Payer: Self-pay | Admitting: Obstetrics and Gynecology

## 2012-05-11 ENCOUNTER — Ambulatory Visit
Admission: RE | Admit: 2012-05-11 | Discharge: 2012-05-11 | Disposition: A | Discharge status: Home / Self Care | Payer: 59 | Source: Ambulatory Visit | Attending: Obstetrics and Gynecology | Admitting: Obstetrics and Gynecology

## 2012-05-11 DIAGNOSIS — R921 Mammographic calcification found on diagnostic imaging of breast: Secondary | ICD-10-CM

## 2012-05-12 ENCOUNTER — Ambulatory Visit
Admission: RE | Admit: 2012-05-12 | Discharge: 2012-05-12 | Disposition: A | Discharge status: Home / Self Care | Payer: 59 | Source: Ambulatory Visit | Attending: Obstetrics and Gynecology | Admitting: Obstetrics and Gynecology

## 2012-05-12 DIAGNOSIS — R921 Mammographic calcification found on diagnostic imaging of breast: Secondary | ICD-10-CM

## 2012-07-08 ENCOUNTER — Other Ambulatory Visit: Payer: Self-pay | Admitting: Nurse Practitioner

## 2012-07-08 ENCOUNTER — Other Ambulatory Visit: Payer: Self-pay | Admitting: Family Medicine

## 2012-07-08 NOTE — Telephone Encounter (Signed)
Call in #60 with 5 rf 

## 2012-07-13 ENCOUNTER — Other Ambulatory Visit: Payer: Self-pay | Admitting: Nurse Practitioner

## 2012-08-12 ENCOUNTER — Other Ambulatory Visit: Payer: Self-pay | Admitting: Family Medicine

## 2012-09-11 ENCOUNTER — Other Ambulatory Visit: Payer: Self-pay | Admitting: Family Medicine

## 2012-10-11 ENCOUNTER — Other Ambulatory Visit: Payer: Self-pay | Admitting: Obstetrics and Gynecology

## 2012-10-11 DIAGNOSIS — Z1231 Encounter for screening mammogram for malignant neoplasm of breast: Secondary | ICD-10-CM

## 2012-10-12 ENCOUNTER — Other Ambulatory Visit: Payer: Self-pay | Admitting: Obstetrics and Gynecology

## 2012-11-17 ENCOUNTER — Telehealth: Payer: Self-pay | Admitting: Genetic Counselor

## 2012-11-17 ENCOUNTER — Ambulatory Visit
Admission: RE | Admit: 2012-11-17 | Discharge: 2012-11-17 | Disposition: A | Discharge status: Home / Self Care | Payer: 59 | Source: Ambulatory Visit | Attending: Obstetrics and Gynecology | Admitting: Obstetrics and Gynecology

## 2012-11-17 DIAGNOSIS — Z1231 Encounter for screening mammogram for malignant neoplasm of breast: Secondary | ICD-10-CM

## 2012-11-17 NOTE — Telephone Encounter (Signed)
S/W PT IN REF. TO NP APPT. WITH KAREN POWELL REFERRING DR ROSS  [facility] MAILED NP PACKET

## 2013-01-13 ENCOUNTER — Other Ambulatory Visit: Payer: Self-pay | Admitting: Family Medicine

## 2013-01-13 NOTE — Telephone Encounter (Signed)
Call in #60 with no refills. She needs an OV

## 2013-01-23 ENCOUNTER — Ambulatory Visit: Payer: 59 | Admitting: Genetic Counselor

## 2013-01-23 ENCOUNTER — Other Ambulatory Visit: Payer: 59 | Admitting: Lab

## 2013-02-07 ENCOUNTER — Ambulatory Visit (INDEPENDENT_AMBULATORY_CARE_PROVIDER_SITE_OTHER): Payer: 59 | Admitting: Family Medicine

## 2013-02-07 ENCOUNTER — Encounter: Payer: Self-pay | Admitting: Family Medicine

## 2013-02-07 VITALS — BP 126/80 | HR 91 | Temp 98.1°F | Wt 197.0 lb

## 2013-02-07 DIAGNOSIS — M79609 Pain in unspecified limb: Secondary | ICD-10-CM

## 2013-02-07 DIAGNOSIS — N3281 Overactive bladder: Secondary | ICD-10-CM

## 2013-02-07 DIAGNOSIS — F329 Major depressive disorder, single episode, unspecified: Secondary | ICD-10-CM

## 2013-02-07 DIAGNOSIS — M79671 Pain in right foot: Secondary | ICD-10-CM

## 2013-02-07 DIAGNOSIS — F3289 Other specified depressive episodes: Secondary | ICD-10-CM

## 2013-02-07 DIAGNOSIS — N318 Other neuromuscular dysfunction of bladder: Secondary | ICD-10-CM

## 2013-02-07 MED ORDER — DULOXETINE HCL 60 MG PO CPEP: 60.0000 mg | ORAL_CAPSULE | Freq: Every day | ORAL | Status: DC

## 2013-02-07 MED ORDER — TOLTERODINE TARTRATE 2 MG PO TABS: 2.0000 mg | ORAL_TABLET | Freq: Two times a day (BID) | ORAL | Status: DC

## 2013-02-07 MED ORDER — TRAMADOL HCL 50 MG PO TABS: 100.0000 mg | ORAL_TABLET | Freq: Four times a day (QID) | ORAL | Status: DC | PRN

## 2013-02-07 MED ORDER — ZOLPIDEM TARTRATE 10 MG PO TABS: 10.0000 mg | ORAL_TABLET | Freq: Every evening | ORAL | Status: DC | PRN

## 2013-02-07 NOTE — Progress Notes (Signed)
  Subjective:    Patient ID: Stacy Hays, female    DOB: 1971/01/19, 42 y.o.   MRN: [id number]  HPI Here for refills and for pain in the right heel for the past 3 months. She had high frequency [place] on this heel for fasciitis about 8 years ago. No recent trauma. Also her insurance will not cover Vesicare.    Review of Systems  Constitutional: Negative.   Genitourinary: Negative.   Musculoskeletal: Positive for arthralgias.       Objective:   Physical Exam  Constitutional: She is oriented to person, place, and time. She appears well-developed and well-nourished.  Musculoskeletal:  Tender over the right heel, no swelling   Neurological: She is alert and oriented to person, place, and time.  Psychiatric: She has a normal mood and affect. Her behavior is normal. Thought content normal.          Assessment & Plan:  She has plantar fasciitis again so we will refer her to Orthopedics. Switch from [facility] to [employer].

## 2013-02-09 ENCOUNTER — Other Ambulatory Visit: Payer: Self-pay | Admitting: Nurse Practitioner

## 2013-02-14 ENCOUNTER — Other Ambulatory Visit: Payer: Self-pay | Admitting: *Deleted

## 2013-02-14 ENCOUNTER — Telehealth: Payer: Self-pay | Admitting: Family Medicine

## 2013-02-14 MED ORDER — DULOXETINE HCL 60 MG PO CPEP: 60.0000 mg | ORAL_CAPSULE | Freq: Every day | ORAL | Status: DC

## 2013-02-14 NOTE — Telephone Encounter (Signed)
Switch from Cymbalta 60 to 30 mg daily. Call in one year supply

## 2013-02-14 NOTE — Telephone Encounter (Signed)
Pt left voice message, she said that the Cymbalta had changed during her last office visit, from 60 mg to 30 mg. Can we send in for the new script?

## 2013-02-16 ENCOUNTER — Other Ambulatory Visit: Payer: Self-pay | Admitting: Nurse Practitioner

## 2013-02-17 ENCOUNTER — Telehealth: Payer: Self-pay | Admitting: Family Medicine

## 2013-02-17 DIAGNOSIS — G43109 Migraine with aura, not intractable, without status migrainosus: Secondary | ICD-10-CM

## 2013-02-17 MED ORDER — DULOXETINE HCL 30 MG PO CPEP: 30.0000 mg | ORAL_CAPSULE | Freq: Every day | ORAL | Status: DC

## 2013-02-17 MED ORDER — TOLTERODINE TARTRATE ER 4 MG PO CP24: 4.0000 mg | ORAL_CAPSULE | Freq: Every day | ORAL | Status: DC

## 2013-02-17 NOTE — Telephone Encounter (Signed)
Change Cymbalta to 30 mg q day, call in one year supply. Change the Tolteridine to LA 4 mg q day, call in one year supply

## 2013-02-17 NOTE — Telephone Encounter (Signed)
I called in one script and sent the other e-scribe

## 2013-02-17 NOTE — Telephone Encounter (Signed)
Pharm need to verify this NEW script: DULoxetine (CYMBALTA) 60 MG capsule (Pt has always been on to be 30Mg )  Pls advise Pt's insurance will not cover :tolterodine (DETROL) 2 MG tablet... Would like to change to Extended release tolterodine  (they will cover that....is that OK? Pharm no: [phone]

## 2013-02-20 ENCOUNTER — Telehealth: Payer: Self-pay | Admitting: Family Medicine

## 2013-02-20 NOTE — Telephone Encounter (Signed)
Yes the 2 mg is fine but take 2 at a time

## 2013-02-20 NOTE — Telephone Encounter (Signed)
Request from pharmacy to change script, plain Detrol not available. Can we use Detrol LA 2 mg?

## 2013-02-21 MED ORDER — TOLTERODINE TARTRATE ER 2 MG PO CP24: 4.0000 mg | ORAL_CAPSULE | Freq: Every day | ORAL | Status: DC

## 2013-02-21 NOTE — Telephone Encounter (Signed)
I sent script e-scribe and left voice message with the below information.

## 2013-07-31 ENCOUNTER — Telehealth: Payer: Self-pay | Admitting: Family Medicine

## 2013-07-31 MED ORDER — POLYMYXIN B-TRIMETHOPRIM 10000-0.1 UNIT/ML-% OP SOLN: 2.0000 [drp] | Freq: Four times a day (QID) | OPHTHALMIC | Status: DC | PRN

## 2013-07-31 NOTE — Telephone Encounter (Signed)
I sent script e-scribe and spoke with pt. 

## 2013-07-31 NOTE — Telephone Encounter (Signed)
Call in Polytrim drops as above

## 2013-07-31 NOTE — Telephone Encounter (Signed)
Patient Information:  Caller Name: Stacy Hays  Phone: [phone]  Patient: Stacy Hays  Gender: Female  DOB: [birth year]  Age: 42 Years  PCP: Gershon Crane [facility])  Pregnant: No  Office Follow Up:  Does the office need to follow up with this patient?: No  Instructions For The Office: N/A  RN Note:  Onset of pus and redness and itching left eye on arising AM 07/31/13.  Afebrile.  Emergent symptoms denied per eye with pus protocol; advised care measures, with callback parameters given.  Rx for polytrim eye drops, 2 gtts OU QID x 5 days, #1 bottle, NR called in per standing orders to [facility] [phone].  krs/can  Symptoms  Reason For Call & Symptoms: ? pinkeye  Reviewed Health History In EMR: Yes  Reviewed Medications In EMR: Yes  Reviewed Allergies In EMR: Yes  Reviewed Surgeries / Procedures: Yes  Date of Onset of Symptoms: 07/31/2013 OB / GYN:  LMP: Unknown  Guideline(s) Used:  Eye - Pus or Discharge  Disposition Per Guideline:   Home Care  Reason For Disposition Reached:   Eye with yellow/green discharge or eyelashes stick together, and PCP standing order to call in antibiotic eye drops  Advice Given:  Reassurance:  A small amount of pus or mucus in the inner corner of the eye is often due to a cold or virus. Sometimes it's just a reaction to an irritant in the eye.  You can get pink eye from someone who has had it recently.  Pink eye will not harm your eyesight.  Eyelid Cleansing:   Gently wash eyelids and lashes with warm water and wet cotton balls (or cotton gauze). Remove all the dried and liquid pus.  Do this as often as needed.  Contact Lenses:  Switch to glasses for a short time. This will help stop damage to your eye.  Clean your contacts before wearing them again.  Throw away used contacts if they are meant to be thrown away.  Contagiousness:  Pinkeye is contagious. It is very easy to spread to other people. You can spread it by shaking  hands.  Try not to touch your eyes. Wash your hands often. Do not share towels. Try not to touch your eyes. Wash your hands frequently. Do not share towels.  You may return to work or school. Avoid physical contact (e.g., shaking hands) until the symptoms have resolved.  Call Back If  Pus increases in amount  Pus in corner of eye lasts over 3 days  Eyelid becomes red or swollen  You become worse.  Patient Will Follow Care Advice:  YES

## 2013-08-16 ENCOUNTER — Other Ambulatory Visit: Payer: Self-pay | Admitting: Family Medicine

## 2013-08-16 NOTE — Telephone Encounter (Signed)
Call in #30 with 5 rf 

## 2013-10-03 ENCOUNTER — Other Ambulatory Visit: Payer: Self-pay | Admitting: Family Medicine

## 2013-10-04 NOTE — Telephone Encounter (Signed)
Call in #[phone] with 5 rf 

## 2013-10-17 ENCOUNTER — Other Ambulatory Visit: Payer: Self-pay | Admitting: Obstetrics and Gynecology

## 2013-11-07 ENCOUNTER — Other Ambulatory Visit (INDEPENDENT_AMBULATORY_CARE_PROVIDER_SITE_OTHER): Payer: 59

## 2013-11-07 DIAGNOSIS — Z0000 Encounter for general adult medical examination without abnormal findings: Secondary | ICD-10-CM

## 2013-11-07 LAB — CBC WITH DIFFERENTIAL/PLATELET
Basophils Relative: 0.4 % (ref 0.0–3.0)
Eosinophils Absolute: 0.4 10*3/uL (ref 0.0–0.7)
Eosinophils Relative: 5.1 % — ABNORMAL HIGH (ref 0.0–5.0)
Lymphocytes Relative: 23.1 % (ref 12.0–46.0)
MCHC: 33.9 g/dL (ref 30.0–36.0)
Monocytes Relative: 4.6 % (ref 3.0–12.0)
Neutrophils Relative %: 66.8 % (ref 43.0–77.0)
RBC: 4.89 Mil/uL (ref 3.87–5.11)
WBC: 8.2 10*3/uL (ref 4.5–10.5)

## 2013-11-07 LAB — BASIC METABOLIC PANEL
Calcium: 9.2 mg/dL (ref 8.4–10.5)
Creatinine, Ser: 0.8 mg/dL (ref 0.4–1.2)
GFR: 83.33 mL/min (ref >60.00)
Sodium: 137 mEq/L (ref 135–145)

## 2013-11-07 LAB — HEPATIC FUNCTION PANEL
AST: 17 U/L (ref 0–37)
Alkaline Phosphatase: 68 U/L (ref 39–117)
Bilirubin, Direct: 0 mg/dL (ref 0.0–0.3)
Total Protein: 7 g/dL (ref 6.0–8.3)

## 2013-11-07 LAB — POCT URINALYSIS DIPSTICK
Ketones, UA: NEGATIVE
Protein, UA: NEGATIVE
Spec Grav, UA: 1.02

## 2013-11-07 LAB — LIPID PANEL
Cholesterol: 204 mg/dL — ABNORMAL HIGH (ref 0–200)
Total CHOL/HDL Ratio: 4
Triglycerides: 89 mg/dL (ref 0.0–149.0)
VLDL: 17.8 mg/dL (ref 0.0–40.0)

## 2013-11-13 ENCOUNTER — Encounter: Payer: Self-pay | Admitting: Family Medicine

## 2013-11-14 ENCOUNTER — Ambulatory Visit (INDEPENDENT_AMBULATORY_CARE_PROVIDER_SITE_OTHER): Payer: 59 | Admitting: Family Medicine

## 2013-11-14 ENCOUNTER — Encounter: Payer: Self-pay | Admitting: Family Medicine

## 2013-11-14 VITALS — BP 140/80 | HR 69 | Temp 98.3°F | Ht 66.0 in | Wt 200.0 lb

## 2013-11-14 DIAGNOSIS — G43109 Migraine with aura, not intractable, without status migrainosus: Secondary | ICD-10-CM

## 2013-11-14 DIAGNOSIS — Z0000 Encounter for general adult medical examination without abnormal findings: Secondary | ICD-10-CM

## 2013-11-14 MED ORDER — SUMATRIPTAN SUCCINATE 100 MG PO TABS: ORAL_TABLET | ORAL | Status: DC

## 2013-11-14 MED ORDER — TOLTERODINE TARTRATE ER 4 MG PO CP24: 4.0000 mg | ORAL_CAPSULE | Freq: Every day | ORAL | Status: DC

## 2013-11-14 MED ORDER — DULOXETINE HCL 30 MG PO CPEP: 30.0000 mg | ORAL_CAPSULE | Freq: Every day | ORAL | Status: DC

## 2013-11-14 NOTE — Progress Notes (Signed)
  Subjective:    Patient ID: Stacy Hays, female    DOB: Apr 30, 1971, 42 y.o.   MRN: [id number]  HPI 42 yr old female for a cpx. She feels well.    Review of Systems  Constitutional: Negative.   HENT: Negative.   Eyes: Negative.   Respiratory: Negative.   Cardiovascular: Negative.   Gastrointestinal: Negative.   Genitourinary: Negative for dysuria, urgency, frequency, hematuria, flank pain, decreased urine volume, enuresis, difficulty urinating, pelvic pain and dyspareunia.  Musculoskeletal: Negative.   Skin: Negative.   Neurological: Negative.   Psychiatric/Behavioral: Negative.        Objective:   Physical Exam  Constitutional: She is oriented to person, place, and time. She appears well-developed and well-nourished. No distress.  HENT:  Head: Normocephalic and atraumatic.  Right Ear: External ear normal.  Left Ear: External ear normal.  Nose: Nose normal.  Mouth/Throat: Oropharynx is clear and moist. No oropharyngeal exudate.  Eyes: Conjunctivae and EOM are normal. Pupils are equal, round, and reactive to light. No scleral icterus.  Neck: Normal range of motion. Neck supple. No JVD present. No thyromegaly present.  Cardiovascular: Normal rate, regular rhythm, normal heart sounds and intact distal pulses.  Exam reveals no gallop and no friction rub.   No murmur heard. Pulmonary/Chest: Effort normal and breath sounds normal. No respiratory distress. She has no wheezes. She has no rales. She exhibits no tenderness.  Abdominal: Soft. Bowel sounds are normal. She exhibits no distension and no mass. There is no tenderness. There is no rebound and no guarding.  Musculoskeletal: Normal range of motion. She exhibits no edema and no tenderness.  Lymphadenopathy:    She has no cervical adenopathy.  Neurological: She is alert and oriented to person, place, and time. She has normal reflexes. No cranial nerve deficit. She exhibits normal muscle tone. Coordination normal.  Skin: Skin is  warm and dry. No rash noted. No erythema.  Psychiatric: She has a normal mood and affect. Her behavior is normal. Judgment and thought content normal.          Assessment & Plan:  Well exam.

## 2013-11-14 NOTE — Progress Notes (Signed)
Pre visit review using our clinic review tool, if applicable. No additional management support is needed unless otherwise documented below in the visit note. 

## 2013-11-16 ENCOUNTER — Other Ambulatory Visit: Payer: Self-pay

## 2013-11-16 DIAGNOSIS — Z1231 Encounter for screening mammogram for malignant neoplasm of breast: Secondary | ICD-10-CM

## 2013-12-19 ENCOUNTER — Ambulatory Visit
Admission: RE | Admit: 2013-12-19 | Discharge: 2013-12-19 | Disposition: A | Discharge status: Home / Self Care | Payer: 59 | Source: Ambulatory Visit

## 2013-12-19 DIAGNOSIS — Z1231 Encounter for screening mammogram for malignant neoplasm of breast: Secondary | ICD-10-CM

## 2013-12-30 ENCOUNTER — Other Ambulatory Visit: Payer: Self-pay | Admitting: Family Medicine

## 2014-01-01 ENCOUNTER — Telehealth: Payer: Self-pay | Admitting: Family Medicine

## 2014-01-01 NOTE — Telephone Encounter (Signed)
I called pharmacy and pt does have refills left on the Tramadol. I advised pt to contact pharmacy for any other refills and that we would respond electronically.

## 2014-01-01 NOTE — Telephone Encounter (Signed)
Pt calling to request:  1. Refill of traMADol (ULTRAM) 50 MG tablet, states she will run out tomorrow  2. Refill of all other medications sent to pharmacy, pt states she was seen in the office in November and was informed refills for all medications were sent to [facility].  However, she has contacted the pharmacy and they state they have not received any refills.

## 2014-01-01 NOTE — Telephone Encounter (Signed)
Call in #120 with no refills. She needs an OV to discuss why she is taking so many of these and also for testing and to sign a contract

## 2014-01-02 ENCOUNTER — Telehealth: Payer: Self-pay | Admitting: Family Medicine

## 2014-01-02 MED ORDER — TRAMADOL HCL 50 MG PO TABS: 100.0000 mg | ORAL_TABLET | Freq: Every day | ORAL | Status: DC | PRN

## 2014-01-02 NOTE — Telephone Encounter (Signed)
Pt was just seen here for a CPE, she didn't understand the previous message about the Ultram. She stated that she takes 2 per day and has not had it filled since October. I did call in the script today.

## 2014-01-02 NOTE — Telephone Encounter (Signed)
We had originally told her she needed an OV for refills because we had given her #120 with 5rf last October. Now she says the pharmacy siad she had no refills at all. Please call the pharmacy and make sure she has #120 with 5 rf available and tell Stacy Hays she does NOT need another OV

## 2014-01-02 NOTE — Telephone Encounter (Signed)
I spoke with pharmacy and she does have refills, also I did speak with pharmacy.

## 2014-01-03 ENCOUNTER — Encounter: Payer: Self-pay | Admitting: Family Medicine

## 2014-01-03 ENCOUNTER — Ambulatory Visit (INDEPENDENT_AMBULATORY_CARE_PROVIDER_SITE_OTHER): Payer: 59 | Admitting: Family Medicine

## 2014-01-03 VITALS — BP 128/84 | HR 107 | Temp 98.6°F | Wt 204.0 lb

## 2014-01-03 DIAGNOSIS — N390 Urinary tract infection, site not specified: Secondary | ICD-10-CM

## 2014-01-03 DIAGNOSIS — J209 Acute bronchitis, unspecified: Secondary | ICD-10-CM

## 2014-01-03 DIAGNOSIS — R319 Hematuria, unspecified: Secondary | ICD-10-CM

## 2014-01-03 LAB — POCT URINALYSIS DIPSTICK
BILIRUBIN UA: NEGATIVE
GLUCOSE UA: NEGATIVE
KETONES UA: NEGATIVE
NITRITE UA: NEGATIVE
PH UA: 5.5
Spec Grav, UA: 1.01
Urobilinogen, UA: 0.2

## 2014-01-03 MED ORDER — LEVOFLOXACIN 500 MG PO TABS: 500.0000 mg | ORAL_TABLET | Freq: Every day | ORAL | Status: DC

## 2014-01-03 MED ORDER — LEVOFLOXACIN 500 MG PO TABS: 500.0000 mg | ORAL_TABLET | Freq: Every day | ORAL | Status: AC

## 2014-01-03 NOTE — Progress Notes (Signed)
Pre visit review using our clinic review tool, if applicable. No additional management support is needed unless otherwise documented below in the visit note. 

## 2014-01-03 NOTE — Progress Notes (Signed)
   Subjective:    Patient ID: Stacy Hays, female    DOB: 12/18/1971, 43 y.o.   MRN: [id number]  HPI Here for 2 things. First she developed chest tightness and coughing up green sputum one week go. No fever. Then last night she had some urinary burning and blood in the urine.    Review of Systems  Constitutional: Positive for fever.  HENT: Negative.   Eyes: Negative.   Respiratory: Positive for cough and chest tightness.   Gastrointestinal: Negative.   Genitourinary: Positive for dysuria, urgency and frequency. Negative for pelvic pain.       Objective:   Physical Exam  Constitutional: She appears well-developed and well-nourished.  HENT:  Right Ear: External ear normal.  Left Ear: External ear normal.  Nose: Nose normal.  Mouth/Throat: Oropharynx is clear and moist.  Eyes: Conjunctivae are normal.  Pulmonary/Chest: Effort normal and breath sounds normal. No respiratory distress. She has no wheezes. She has no rales.  Abdominal: Soft. Bowel sounds are normal. She exhibits no distension. There is no tenderness. There is no rebound and no guarding.  Lymphadenopathy:    She has no cervical adenopathy.          Assessment & Plan:  She has a bronchitis and a UTI at the same time. Treat both with Levaquin and culture the urine sample. Drink fluids

## 2014-01-06 LAB — URINE CULTURE: Colony Count: 50000

## 2014-01-31 ENCOUNTER — Encounter: Payer: Self-pay | Admitting: Family Medicine

## 2014-02-14 ENCOUNTER — Other Ambulatory Visit: Payer: Self-pay | Admitting: Family Medicine

## 2014-02-16 ENCOUNTER — Other Ambulatory Visit: Payer: Self-pay | Admitting: Family Medicine

## 2014-05-02 ENCOUNTER — Telehealth: Payer: Self-pay | Admitting: Family Medicine

## 2014-05-02 MED ORDER — TOLTERODINE TARTRATE ER 4 MG PO CP24: 4.0000 mg | ORAL_CAPSULE | Freq: Every day | ORAL | Status: DC

## 2014-05-02 NOTE — Telephone Encounter (Signed)
[id number] - [place], [address] AT [address] called requesting a re-fill on tolterodine (DETROL LA) 4 MG 24 hr capsule

## 2014-05-02 NOTE — Telephone Encounter (Signed)
I sent script e-scribe. 

## 2014-05-15 ENCOUNTER — Telehealth: Payer: Self-pay | Admitting: Family Medicine

## 2014-05-15 NOTE — Telephone Encounter (Signed)
Yes, okay to schedule.

## 2014-05-15 NOTE — Telephone Encounter (Signed)
lmom about pt's appt. Per pt.ok to leave message

## 2014-05-15 NOTE — Telephone Encounter (Signed)
Pt states she is having anxiety issues and really would like to speak w/ dr fry concerning this. Pt would like appt tomorrow. Is it ok to schedule? l

## 2014-05-16 ENCOUNTER — Encounter: Payer: Self-pay | Admitting: Family Medicine

## 2014-05-16 ENCOUNTER — Ambulatory Visit (INDEPENDENT_AMBULATORY_CARE_PROVIDER_SITE_OTHER): Payer: 59 | Admitting: Family Medicine

## 2014-05-16 VITALS — BP 131/94 | HR 102 | Temp 98.0°F | Ht 66.0 in | Wt 206.0 lb

## 2014-05-16 DIAGNOSIS — F411 Generalized anxiety disorder: Secondary | ICD-10-CM

## 2014-05-16 MED ORDER — ALPRAZOLAM 0.25 MG PO TABS: 0.2500 mg | ORAL_TABLET | Freq: Three times a day (TID) | ORAL | Status: DC | PRN

## 2014-05-16 NOTE — Progress Notes (Signed)
Pre visit review using our clinic review tool, if applicable. No additional management support is needed unless otherwise documented below in the visit note. 
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
## 2014-05-21 ENCOUNTER — Other Ambulatory Visit: Payer: Self-pay | Admitting: Family Medicine

## 2014-05-22 ENCOUNTER — Encounter: Payer: Self-pay | Admitting: Family Medicine

## 2014-05-22 ENCOUNTER — Ambulatory Visit (INDEPENDENT_AMBULATORY_CARE_PROVIDER_SITE_OTHER): Payer: 59 | Admitting: Family Medicine

## 2014-05-22 VITALS — BP 107/80 | HR 95 | Temp 98.5°F | Ht 66.0 in | Wt 208.0 lb

## 2014-05-22 DIAGNOSIS — F411 Generalized anxiety disorder: Secondary | ICD-10-CM

## 2014-05-22 DIAGNOSIS — G4700 Insomnia, unspecified: Secondary | ICD-10-CM

## 2014-05-22 MED ORDER — TRAMADOL HCL 50 MG PO TABS: 100.0000 mg | ORAL_TABLET | Freq: Every day | ORAL | Status: DC | PRN

## 2014-05-22 MED ORDER — ZOLPIDEM TARTRATE 10 MG PO TABS: ORAL_TABLET | ORAL | Status: DC

## 2014-05-22 NOTE — Progress Notes (Signed)
   Subjective:    Patient ID: Stacy Hays, female    DOB: 04-Jan-1971, 43 y.o.   MRN: [id number]  HPI Here to follow up on anxiety. When we met last week she was having headaches, "roaring in the head", and chest pains but these have all gone away. She is taking xanax TID and this has been very helpful. She feels more relaxed and less overwhelmed, even though her husband's behaviors continue. She has decided not to pursue therapy right now.    Review of Systems  Constitutional: Negative.   Respiratory: Negative.   Cardiovascular: Negative.   Neurological: Negative.   Psychiatric/Behavioral: Negative.        Objective:   Physical Exam  Constitutional: She is oriented to person, place, and time. She appears well-developed and well-nourished. No distress.  Neurological: She is alert and oriented to person, place, and time.  Psychiatric: She has a normal mood and affect. Her behavior is normal. Thought content normal.          Assessment & Plan:  She has improved quite a bit. We will stay on the current meds.

## 2014-05-22 NOTE — Progress Notes (Signed)
Pre visit review using our clinic review tool, if applicable. No additional management support is needed unless otherwise documented below in the visit note. 

## 2014-05-22 NOTE — Telephone Encounter (Signed)
Call in #[phone] with 5 rf 

## 2014-05-24 ENCOUNTER — Telehealth: Payer: Self-pay | Admitting: Family Medicine

## 2014-05-24 NOTE — Telephone Encounter (Signed)
[id number] - [place], [place] - [address] is requesting re-fill on traMADol (ULTRAM) 50 MG tablet   It shows it was printed on 04/2614 but pharmacy is still requesting re-fill.

## 2014-05-24 NOTE — Telephone Encounter (Signed)
I tried to reach pt no answer. I called pharmacy to let them know that pt has a printed script.

## 2014-08-03 ENCOUNTER — Other Ambulatory Visit: Payer: Self-pay | Admitting: Family Medicine

## 2014-08-20 ENCOUNTER — Telehealth: Payer: Self-pay | Admitting: Family Medicine

## 2014-08-20 MED ORDER — ALPRAZOLAM 0.25 MG PO TABS: 0.2500 mg | ORAL_TABLET | Freq: Three times a day (TID) | ORAL | Status: DC | PRN

## 2014-08-20 NOTE — Telephone Encounter (Signed)
done

## 2014-08-20 NOTE — Telephone Encounter (Signed)
Script was faxed to [facility] and left a voice message for pt.

## 2014-08-20 NOTE — Telephone Encounter (Signed)
Pt request refill ALPRAZolam (XANAX) 0.25 MG tablet Pt states been trying over week for refill. Pt is out and requesting asap. [facility]

## 2014-08-24 ENCOUNTER — Other Ambulatory Visit: Payer: Self-pay

## 2014-08-24 DIAGNOSIS — Z1231 Encounter for screening mammogram for malignant neoplasm of breast: Secondary | ICD-10-CM

## 2014-08-30 ENCOUNTER — Encounter: Payer: Self-pay | Admitting: Family Medicine

## 2014-08-30 ENCOUNTER — Ambulatory Visit (INDEPENDENT_AMBULATORY_CARE_PROVIDER_SITE_OTHER): Payer: 59 | Admitting: Licensed Clinical Social Worker

## 2014-08-30 ENCOUNTER — Ambulatory Visit (INDEPENDENT_AMBULATORY_CARE_PROVIDER_SITE_OTHER): Payer: 59 | Admitting: Family Medicine

## 2014-08-30 VITALS — BP 140/92 | HR 72 | Temp 98.8°F | Wt 205.0 lb

## 2014-08-30 DIAGNOSIS — F329 Major depressive disorder, single episode, unspecified: Secondary | ICD-10-CM

## 2014-08-30 DIAGNOSIS — F331 Major depressive disorder, recurrent, moderate: Secondary | ICD-10-CM

## 2014-08-30 DIAGNOSIS — R51 Headache: Secondary | ICD-10-CM

## 2014-08-30 DIAGNOSIS — F3289 Other specified depressive episodes: Secondary | ICD-10-CM

## 2014-08-30 DIAGNOSIS — F411 Generalized anxiety disorder: Secondary | ICD-10-CM

## 2014-08-30 NOTE — Assessment & Plan Note (Signed)
Depression is poorly controlled on Cymbalta. Unfortunately cannot titrate due to side effects. I am thankful patient is seeking therapy which will be a powerful adjunct therapy. Due to severity of her anxiety have encouraged her to take her Xanax as prescribed by Dr. Sarajane Jews. She will consider. She will followup with him next week

## 2014-08-30 NOTE — Progress Notes (Signed)
Stacy Reddish, MD Phone: [phone]  Subjective:   Stacy Hays is a 43 y.o. year old very pleasant female patient who presents with the following:  Headache Eye twitching Headache started Friday, on right side of her face/eye area wrapping to back of the head. Patient thinks current headache may have started in her neck. She does complain of neck tension on right side. This is not typical for her migraines-which are usually debilitating and cause nausea/vomiting/photosensitivity. No nausea/vomiting with current episode or photosensitivity.  Right eye "drags" and twitches at times. Does not sound like she's actually had drooping of the eye. Eye twitching is not progressive as the day goes on and does not seem to be linked to fatigue ROS-no blurry vision, no worsening of headache, no nausea or vomiting  Depression and anxiety phq9 of 19. See flowsheet. She thinks her headaches likely stem from her stress level. She admits to multiple stressors at home and states she has a desire to feel like she belongs within her own family.Admits to a lot of stress and anxiety-compulsive/controlling husband and stressful job and rebellious teenagers. She has had extensive discussions with Dr. Sarajane Jews in the past about the stressors. The central portion of her stress revolves around her husband. There are elements that are almost PTSD like even though she admits many of his behaviors have improved. She has finally decided to pursue therapy and is meeting with Richardo Priest this afternoon.  Patient states she did not tolerate 60mg  cymbalta. She has Xanax at home  but does not take 3 times a day as instructed Typically she only takes it once a day. She admits the Xanax helped the"roaring in the head" which she experienced previously.  ROS-no SI HI  Past Medical History-anxiety, insomnia, arthritis, history of migraines  Medications- reviewed and updated Current Outpatient Prescriptions  Medication Sig Dispense  Refill  . CRYSELLE-28 0.3-30 MG-MCG tablet       . DULoxetine (CYMBALTA) 30 MG capsule TAKE ONE CAPSULE BY MOUTH EVERY DAY  30 capsule  11  . Misc Natural Products (GLUCOS-CHONDROIT-MSM COMPLEX PO) Take 1 tablet by mouth daily.        . Multiple Vitamin (MULTIVITAMIN) tablet Take 1 tablet by mouth daily.      . SUMAtriptan (IMITREX) 100 MG tablet TAKE 1 TABLET BY MOUTH AS NEEDED FOR HEADACHE  10 tablet  11  . traMADol (ULTRAM) 50 MG tablet Take 2 tablets (100 mg total) by mouth daily as needed.  120 tablet  5  . vitamin C (ASCORBIC ACID) 500 MG tablet Take 500 mg by mouth daily.        Marland Kitchen VITAMIN D, CHOLECALCIFEROL, PO Take 1 capsule by mouth daily.        Marland Kitchen zolpidem (AMBIEN) 10 MG tablet TAKE 1 TABLET BY MOUTH EVERY NIGHT AT BEDTIME AS NEEDED FOR SLEEP  30 tablet  5  . ALPRAZolam (XANAX) 0.25 MG tablet Take 1 tablet (0.25 mg total) by mouth 3 (three) times daily as needed for anxiety.  90 tablet  5  . ondansetron (ZOFRAN) 8 MG tablet 8 mg as needed.       . SUMAtriptan-naproxen (TREXIMET) 85-500 MG per tablet Take 1 tablet by mouth every 2 (two) hours as needed for migraine.  3 tablet  0  . tolterodine (DETROL LA) 4 MG 24 hr capsule TAKE ONE CAPSULE BY MOUTH DAILY  90 capsule  0  . trimethoprim-polymyxin b (POLYTRIM) ophthalmic solution Place 2 drops into the left eye 4 (  four) times daily as needed.  10 mL  0   No current facility-administered medications for this visit.    Objective: BP 140/92  Pulse 72  Temp(Src) 98.8 F (37.1 C)  Wt 205 lb (92.987 kg) Gen: NAD, resting comfortably in chair, after start discussing husband, very tearful HEENT: NCAT. PERRLA.  CV: RRR no mrg  Lungs: CTAB  MSK: moves all extremities, no edema  Skin: warm and dry, no rash  Neuro: CN II-XII intact (no lid lag or droop, normal elevation of eyebrows), sensation and reflexes normal throughout, 5/5 muscle strength in bilateral upper and lower extremities. Normal finger to nose. Normal rapid alternating  movements.   Assessment/Plan:  Headache Eye twitching I think her headache is a tension headache from recent stressors. Is not typical of her migraines. I discussed with her I think the best treatment for this is the therapy she is already pursuing. She can continue NSAIDs as needed. Regarding her eye twitching or dragging as she describes it, she has a normal neurological exam today with no facial asymmetry. I highly doubt Bell's palsy or myasthenia gravis although these are possibilities. I discussed with her reasons for return. Due to her trust with Dr. Sarajane Jews, I asked her to return at her earliest convenience to meet with him to follow up on these issues.  Depressive Disorder, not Elsewhere Classified Depression is poorly controlled on Cymbalta. Unfortunately cannot titrate due to side effects. I am thankful patient is seeking therapy which will be a powerful adjunct therapy. Due to severity of her anxiety have encouraged her to take her Xanax as prescribed by Dr. Sarajane Jews. She will consider. She will followup with him next week  >50% of 35 minute office visit was spent on counseling (dealing with depression/anxiety, planning for future, CBT training) and coordination of care  Suspect blood pressure elevated due to anxiety

## 2014-08-30 NOTE — Patient Instructions (Signed)
No AVS required per patient request-shredded.   Encouraged patient to follow up Thursday or Friday with Dr. Sarajane Jews and to seek care sooner if symptoms worsened.

## 2014-09-06 ENCOUNTER — Ambulatory Visit (INDEPENDENT_AMBULATORY_CARE_PROVIDER_SITE_OTHER): Payer: 59 | Admitting: Licensed Clinical Social Worker

## 2014-09-06 DIAGNOSIS — F331 Major depressive disorder, recurrent, moderate: Secondary | ICD-10-CM

## 2014-09-06 DIAGNOSIS — F411 Generalized anxiety disorder: Secondary | ICD-10-CM

## 2014-09-13 ENCOUNTER — Ambulatory Visit (INDEPENDENT_AMBULATORY_CARE_PROVIDER_SITE_OTHER): Payer: 59 | Admitting: Licensed Clinical Social Worker

## 2014-09-13 DIAGNOSIS — F411 Generalized anxiety disorder: Secondary | ICD-10-CM

## 2014-09-13 DIAGNOSIS — F331 Major depressive disorder, recurrent, moderate: Secondary | ICD-10-CM

## 2014-10-31 ENCOUNTER — Other Ambulatory Visit: Payer: Self-pay | Admitting: Family Medicine

## 2014-11-28 ENCOUNTER — Other Ambulatory Visit: Payer: Self-pay | Admitting: Family Medicine

## 2014-11-28 NOTE — Telephone Encounter (Signed)
Refill both for 6 months. 

## 2014-12-03 ENCOUNTER — Other Ambulatory Visit: Payer: Self-pay | Admitting: Obstetrics and Gynecology

## 2014-12-04 LAB — CYTOLOGY - PAP

## 2014-12-24 ENCOUNTER — Encounter (INDEPENDENT_AMBULATORY_CARE_PROVIDER_SITE_OTHER): Payer: Self-pay

## 2014-12-24 ENCOUNTER — Ambulatory Visit
Admission: RE | Admit: 2014-12-24 | Discharge: 2014-12-24 | Disposition: A | Discharge status: Home / Self Care | Payer: 59 | Source: Ambulatory Visit

## 2014-12-24 DIAGNOSIS — Z1231 Encounter for screening mammogram for malignant neoplasm of breast: Secondary | ICD-10-CM

## 2015-02-20 ENCOUNTER — Other Ambulatory Visit: Payer: Self-pay | Admitting: Family Medicine

## 2015-03-05 ENCOUNTER — Ambulatory Visit: Payer: Self-pay | Admitting: Family Medicine

## 2015-03-12 ENCOUNTER — Other Ambulatory Visit: Payer: Self-pay | Admitting: Family Medicine

## 2015-03-13 NOTE — Telephone Encounter (Signed)
Call in #90 with 5 rf 

## 2015-04-28 ENCOUNTER — Other Ambulatory Visit: Payer: Self-pay | Admitting: Family Medicine

## 2015-06-26 ENCOUNTER — Other Ambulatory Visit: Payer: Self-pay | Admitting: Family Medicine

## 2015-06-28 NOTE — Telephone Encounter (Signed)
Refill for 6 months. 

## 2015-07-23 ENCOUNTER — Other Ambulatory Visit: Payer: Self-pay | Admitting: Family Medicine

## 2015-08-14 ENCOUNTER — Encounter: Payer: Self-pay | Admitting: Adult Health

## 2015-08-14 ENCOUNTER — Ambulatory Visit (INDEPENDENT_AMBULATORY_CARE_PROVIDER_SITE_OTHER): Payer: 59 | Admitting: Adult Health

## 2015-08-14 VITALS — BP 120/80 | HR 108 | Temp 97.7°F | Ht 66.0 in | Wt 204.1 lb

## 2015-08-14 DIAGNOSIS — J069 Acute upper respiratory infection, unspecified: Secondary | ICD-10-CM | POA: Diagnosis not present

## 2015-08-14 MED ORDER — HYDROCODONE-HOMATROPINE 5-1.5 MG/5ML PO SYRP: 5.0000 mL | ORAL_SOLUTION | Freq: Four times a day (QID) | ORAL | Status: DC | PRN

## 2015-08-14 NOTE — Progress Notes (Signed)
Pre visit review using our clinic review tool, if applicable. No additional management support is needed unless otherwise documented below in the visit note. 

## 2015-08-14 NOTE — Progress Notes (Signed)
Subjective:    Patient ID: Stacy Hays, female    DOB: 09-Dec-1971, 44 y.o.   MRN: [id number]  HPI  44 year old female who presents to the office today for an acute issues related to productive cough, congestion, body aches, sore throat, chills, possible fever, fatigue, headaches and sinus pain and pressure since Saturday. She has used OTC decongestant which helped with her symptoms.    She was visiting her brother in [place] and her sister in law had the same symptoms. Now she, her daughter, mother, and brother all have the same symptoms.   Denies vomiting or diarrhea but has nausea.   Review of Systems  Constitutional: Positive for fever (?), activity change and fatigue. Negative for chills and diaphoresis.  HENT: Positive for congestion, rhinorrhea, sinus pressure, sore throat and voice change. Negative for ear discharge, ear pain, facial swelling, tinnitus and trouble swallowing.   Eyes: Negative for photophobia, pain and visual disturbance.  Respiratory: Positive for cough. Negative for chest tightness, shortness of breath and wheezing.   Cardiovascular: Negative.   Musculoskeletal: Positive for myalgias. Negative for neck pain and neck stiffness.  Skin: Negative.   Neurological: Positive for headaches.  All other systems reviewed and are negative.  Past Medical History  Diagnosis Date  . Plantar fasciitis   . Arthritis   . Headache(784.0)   . Heart murmur   . Herniated disc   . Depression   . Overactive bladder   . Insomnia   . Dysfunctional uterine bleeding     Social History   Social History  . Marital Status: Married    Spouse Name: N/A  . Number of Children: N/A  . Years of Education: N/A   Occupational History  . Not on file.   Social History Main Topics  . Smoking status: Never Smoker   . Smokeless tobacco: Never Used  . Alcohol Use: Yes     Comment: rare  . Drug Use: No  . Sexual Activity:    Partners: Male   Other Topics Concern  . Not on file    Social History Narrative    Past Surgical History  Procedure Laterality Date  . Leep    . Lumbar disc surgery    . Endometrial ablation    . Abdominal plasty      Family History  Problem Relation Age of Onset  . Diabetes Mother 40  . Hyperlipidemia Mother 11  . Hypertension Mother   . Breast cancer Mother 24  . Heart disease Mother   . Cancer Mother   . Kidney disease    . Prostate cancer    . Sudden death    . Coronary artery disease    . Heart disease Maternal Grandfather   . Birth defects Paternal Grandmother   . Heart disease Paternal Grandfather   . Diabetes Paternal Grandfather     Allergies  Allergen Reactions  . Promethazine Hcl     REACTION: Dystonic reaction    Current Outpatient Prescriptions on File Prior to Visit  Medication Sig Dispense Refill  . ALPRAZolam (XANAX) 0.25 MG tablet TAKE 1 TABLET (0.25MG  TOTAL) BY MOUTH THREE TIMES DAILY AS NEEDED FOR ANXIETY 90 tablet 5  . CRYSELLE-28 0.3-30 MG-MCG tablet     . DULoxetine (CYMBALTA) 30 MG capsule TAKE ONE CAPSULE BY MOUTH EVERY DAY 30 capsule 11  . Misc Natural Products (GLUCOS-CHONDROIT-MSM COMPLEX PO) Take 1 tablet by mouth daily.      . Multiple Vitamin (MULTIVITAMIN) tablet  Take 1 tablet by mouth daily.    . ondansetron (ZOFRAN) 8 MG tablet 8 mg as needed.     . SUMAtriptan (IMITREX) 100 MG tablet TAKE 1 TABLET BY MOUTH AS NEEDED FOR HEADACHE 10 tablet 3  . tolterodine (DETROL LA) 4 MG 24 hr capsule TAKE 1 CAPSULE BY MOUTH EVERY DAY 90 capsule 0  . traMADol (ULTRAM) 50 MG tablet TAKE 2 TABLETS BY MOUTH EVERY DAY AS NEEDED 120 tablet 5  . trimethoprim-polymyxin b (POLYTRIM) ophthalmic solution Place 2 drops into the left eye 4 (four) times daily as needed. 10 mL 0  . vitamin C (ASCORBIC ACID) 500 MG tablet Take 500 mg by mouth daily.      Marland Kitchen VITAMIN D, CHOLECALCIFEROL, PO Take 1 capsule by mouth daily.      Marland Kitchen zolpidem (AMBIEN) 10 MG tablet TAKE 1 TABLET BY MOUTH EVERY NIGHT AT BEDTIME AS NEEDED 30  tablet 5  . SUMAtriptan-naproxen (TREXIMET) 85-500 MG per tablet Take 1 tablet by mouth every 2 (two) hours as needed for migraine. 3 tablet 0   No current facility-administered medications on file prior to visit.    BP 120/80 mmHg  Pulse 108  Temp(Src) 97.7 F (36.5 C) (Oral)  Ht 5\' 6"  (1.676 m)  Wt 204 lb 1.6 oz (92.579 kg)  BMI 32.96 kg/m2  SpO2 98%       Objective:   Physical Exam  Constitutional: She is oriented to person, place, and time. She appears well-developed and well-nourished. No distress.  Neck: Normal range of motion. Neck supple. No thyromegaly present.  Cardiovascular: Normal rate, regular rhythm, normal heart sounds and intact distal pulses.  Exam reveals no gallop and no friction rub.   No murmur heard. Pulmonary/Chest: Effort normal and breath sounds normal. No respiratory distress. She has no wheezes. She has no rales. She exhibits no tenderness.  Musculoskeletal: Normal range of motion. She exhibits no edema or tenderness.  Lymphadenopathy:    She has cervical adenopathy.  Neurological: She is alert and oriented to person, place, and time.  Skin: Skin is warm and dry. No rash noted. She is not diaphoretic. No erythema. No pallor.  Psychiatric: She has a normal mood and affect. Her behavior is normal. Judgment and thought content normal.  Nursing note and vitals reviewed.      Assessment & Plan:  1. Acute upper respiratory infection - Likely viral in nature - HYDROcodone-homatropine (HYCODAN) 5-1.5 MG/5ML syrup; Take 5 mLs by mouth every 6 (six) hours as needed for cough.  Dispense: 120 mL; Refill: 0 - OTC Decongestant and nasal spray such as Flonase - Stay hydrated and rest - Follow up if no improvement in 2-3 days or if symptoms get worse.  -

## 2015-08-14 NOTE — Patient Instructions (Addendum)
It was great meeting you today!  It appears as you have a viral upper respiratory infection. Stay well hydrated. Use an antihistamine and decongestent as well as a steroid nasal spray such as Flonase. Continue to stay well hydrated and get plenty of rest.   Follow up if no improvement in 3-4 days.   Upper Respiratory Infection, Adult An upper respiratory infection (URI) is also sometimes known as the common cold. The upper respiratory tract includes the nose, sinuses, throat, trachea, and bronchi. Bronchi are the airways leading to the lungs. Most people improve within 1 week, but symptoms can last up to 2 weeks. A residual cough may last even longer.  CAUSES Many different viruses can infect the tissues lining the upper respiratory tract. The tissues become irritated and inflamed and often become very moist. Mucus production is also common. A cold is contagious. You can easily spread the virus to others by oral contact. This includes kissing, sharing a glass, coughing, or sneezing. Touching your mouth or nose and then touching a surface, which is then touched by another person, can also spread the virus. SYMPTOMS  Symptoms typically develop 1 to 3 days after you come in contact with a cold virus. Symptoms vary from person to person. They may include:  Runny nose.  Sneezing.  Nasal congestion.  Sinus irritation.  Sore throat.  Loss of voice (laryngitis).  Cough.  Fatigue.  Muscle aches.  Loss of appetite.  Headache.  Low-grade fever. DIAGNOSIS  You might diagnose your own cold based on familiar symptoms, since most people get a cold 2 to 3 times a year. Your caregiver can confirm this based on your exam. Most importantly, your caregiver can check that your symptoms are not due to another disease such as strep throat, sinusitis, pneumonia, asthma, or epiglottitis. Blood tests, throat tests, and X-rays are not necessary to diagnose a common cold, but they may sometimes be helpful  in excluding other more serious diseases. Your caregiver will decide if any further tests are required. RISKS AND COMPLICATIONS  You may be at risk for a more severe case of the common cold if you smoke cigarettes, have chronic heart disease (such as heart failure) or lung disease (such as asthma), or if you have a weakened immune system. The very young and very old are also at risk for more serious infections. Bacterial sinusitis, middle ear infections, and bacterial pneumonia can complicate the common cold. The common cold can worsen asthma and chronic obstructive pulmonary disease (COPD). Sometimes, these complications can require emergency medical care and may be life-threatening. PREVENTION  The best way to protect against getting a cold is to practice good hygiene. Avoid oral or hand contact with people with cold symptoms. Wash your hands often if contact occurs. There is no clear evidence that vitamin C, vitamin E, echinacea, or exercise reduces the chance of developing a cold. However, it is always recommended to get plenty of rest and practice good nutrition. TREATMENT  Treatment is directed at relieving symptoms. There is no cure. Antibiotics are not effective, because the infection is caused by a virus, not by bacteria. Treatment may include:  Increased fluid intake. Sports drinks offer valuable electrolytes, sugars, and fluids.  Breathing heated mist or steam (vaporizer or shower).  Eating chicken soup or other clear broths, and maintaining good nutrition.  Getting plenty of rest.  Using gargles or lozenges for comfort.  Controlling fevers with ibuprofen or acetaminophen as directed by your caregiver.  Increasing usage of  your inhaler if you have asthma. Zinc gel and zinc lozenges, taken in the first 24 hours of the common cold, can shorten the duration and lessen the severity of symptoms. Pain medicines may help with fever, muscle aches, and throat pain. A variety of  non-prescription medicines are available to treat congestion and runny nose. Your caregiver can make recommendations and may suggest nasal or lung inhalers for other symptoms.  HOME CARE INSTRUCTIONS   Only take over-the-counter or prescription medicines for pain, discomfort, or fever as directed by your caregiver.  Use a warm mist humidifier or inhale steam from a shower to increase air moisture. This may keep secretions moist and make it easier to breathe.  Drink enough water and fluids to keep your urine clear or pale yellow.  Rest as needed.  Return to work when your temperature has returned to normal or as your caregiver advises. You may need to stay home longer to avoid infecting others. You can also use a face mask and careful hand washing to prevent spread of the virus. SEEK MEDICAL CARE IF:   After the first few days, you feel you are getting worse rather than better.  You need your caregiver's advice about medicines to control symptoms.  You develop chills, worsening shortness of breath, or brown or red sputum. These may be signs of pneumonia.  You develop yellow or brown nasal discharge or pain in the face, especially when you bend forward. These may be signs of sinusitis.  You develop a fever, swollen neck glands, pain with swallowing, or white areas in the back of your throat. These may be signs of strep throat. SEEK IMMEDIATE MEDICAL CARE IF:   You have a fever.  You develop severe or persistent headache, ear pain, sinus pain, or chest pain.  You develop wheezing, a prolonged cough, cough up blood, or have a change in your usual mucus (if you have chronic lung disease).  You develop sore muscles or a stiff neck. Document Released: 06/09/2001 Document Revised: 03/07/2012 Document Reviewed: 03/21/2014 [facility] Patient Information 2015 [place], [place]. This information is not intended to replace advice given to you by your health care provider. Make sure you discuss any  questions you have with your health care provider.

## 2015-08-20 ENCOUNTER — Encounter: Payer: Self-pay | Admitting: Family Medicine

## 2015-08-20 ENCOUNTER — Other Ambulatory Visit: Payer: Self-pay | Admitting: Family Medicine

## 2015-08-20 ENCOUNTER — Ambulatory Visit (INDEPENDENT_AMBULATORY_CARE_PROVIDER_SITE_OTHER): Payer: 59 | Admitting: Family Medicine

## 2015-08-20 VITALS — BP 119/89 | HR 88 | Temp 98.1°F | Ht 66.0 in | Wt 201.0 lb

## 2015-08-20 DIAGNOSIS — M791 Myalgia: Secondary | ICD-10-CM

## 2015-08-20 DIAGNOSIS — M609 Myositis, unspecified: Secondary | ICD-10-CM | POA: Diagnosis not present

## 2015-08-20 DIAGNOSIS — J019 Acute sinusitis, unspecified: Secondary | ICD-10-CM | POA: Diagnosis not present

## 2015-08-20 DIAGNOSIS — F411 Generalized anxiety disorder: Secondary | ICD-10-CM

## 2015-08-20 DIAGNOSIS — IMO0001 Reserved for inherently not codable concepts without codable children: Secondary | ICD-10-CM

## 2015-08-20 MED ORDER — AMOXICILLIN-POT CLAVULANATE 875-125 MG PO TABS: 1.0000 | ORAL_TABLET | Freq: Two times a day (BID) | ORAL | Status: DC

## 2015-08-20 MED ORDER — POLYMYXIN B-TRIMETHOPRIM 10000-0.1 UNIT/ML-% OP SOLN: 2.0000 [drp] | Freq: Four times a day (QID) | OPHTHALMIC | Status: DC | PRN

## 2015-08-20 MED ORDER — ALPRAZOLAM 0.25 MG PO TABS: 0.2500 mg | ORAL_TABLET | Freq: Three times a day (TID) | ORAL | Status: DC | PRN

## 2015-08-20 MED ORDER — TRAMADOL HCL 50 MG PO TABS: 50.0000 mg | ORAL_TABLET | Freq: Four times a day (QID) | ORAL | Status: DC | PRN

## 2015-08-20 MED ORDER — FLUCONAZOLE 150 MG PO TABS: 150.0000 mg | ORAL_TABLET | ORAL | Status: DC | PRN

## 2015-08-20 MED ORDER — ZOLPIDEM TARTRATE 10 MG PO TABS: 10.0000 mg | ORAL_TABLET | Freq: Every evening | ORAL | Status: DC | PRN

## 2015-08-20 NOTE — Progress Notes (Signed)
Pre visit review using our clinic review tool, if applicable. No additional management support is needed unless otherwise documented below in the visit note. 

## 2015-08-20 NOTE — Progress Notes (Signed)
   Subjective:    Patient ID: Stacy Hays, female    DOB: 05-19-1971, 44 y.o.   MRN: [id number]  HPI Here for med refills and for 2 weeks of sinus pressure, PND, and a dry cough. No fever. On Mucinex.    Review of Systems  Constitutional: Negative.   HENT: Positive for congestion, postnasal drip and sinus pressure.   Eyes: Negative.   Respiratory: Negative.        Objective:   Physical Exam  Constitutional: She appears well-developed and well-nourished.  HENT:  Right Ear: External ear normal.  Left Ear: External ear normal.  Nose: Nose normal.  Mouth/Throat: Oropharynx is clear and moist.  Eyes: Conjunctivae are normal.  Neck: No thyromegaly present.  Cardiovascular: Normal rate, regular rhythm, normal heart sounds and intact distal pulses.   Pulmonary/Chest: Effort normal and breath sounds normal.  Lymphadenopathy:    She has no cervical adenopathy.          Assessment & Plan:  Mayer Masker the sinusitis she is given Augmentin and Diflucan. Other meds are refilled.

## 2015-11-02 ENCOUNTER — Other Ambulatory Visit: Payer: Self-pay | Admitting: Family Medicine

## 2015-11-17 ENCOUNTER — Other Ambulatory Visit: Payer: Self-pay | Admitting: Family Medicine

## 2016-01-17 ENCOUNTER — Other Ambulatory Visit: Payer: Self-pay

## 2016-01-17 DIAGNOSIS — Z1231 Encounter for screening mammogram for malignant neoplasm of breast: Secondary | ICD-10-CM

## 2016-02-01 ENCOUNTER — Other Ambulatory Visit: Payer: Self-pay | Admitting: Family Medicine

## 2016-02-05 ENCOUNTER — Ambulatory Visit
Admission: RE | Admit: 2016-02-05 | Discharge: 2016-02-05 | Disposition: A | Discharge status: Home / Self Care | Payer: 59 | Source: Ambulatory Visit

## 2016-02-05 DIAGNOSIS — Z1231 Encounter for screening mammogram for malignant neoplasm of breast: Secondary | ICD-10-CM

## 2016-02-09 ENCOUNTER — Other Ambulatory Visit: Payer: Self-pay | Admitting: Family Medicine

## 2016-02-11 NOTE — Telephone Encounter (Signed)
Call in #30 with 5 rf 

## 2016-02-29 ENCOUNTER — Other Ambulatory Visit: Payer: Self-pay | Admitting: Family Medicine

## 2016-03-01 ENCOUNTER — Other Ambulatory Visit: Payer: Self-pay | Admitting: Family Medicine

## 2016-03-03 ENCOUNTER — Telehealth: Payer: Self-pay | Admitting: *Deleted

## 2016-03-03 NOTE — Telephone Encounter (Signed)
Call in Xanax #90 and Tramadol #120 with 5 rf each

## 2016-03-03 NOTE — Telephone Encounter (Signed)
[facility] [phone] Refill Request ALPRAZolam (XANAX) 0.25 MG tablet

## 2016-03-04 NOTE — Telephone Encounter (Signed)
Call in #90 with 5 rf 

## 2016-03-04 NOTE — Telephone Encounter (Signed)
I called in script 

## 2016-03-26 ENCOUNTER — Other Ambulatory Visit: Payer: Self-pay | Admitting: Obstetrics and Gynecology

## 2016-03-27 LAB — CYTOLOGY - PAP

## 2016-03-30 ENCOUNTER — Other Ambulatory Visit: Payer: Self-pay | Admitting: Family Medicine

## 2016-04-27 ENCOUNTER — Other Ambulatory Visit: Payer: Self-pay | Admitting: Family Medicine

## 2016-04-27 NOTE — Telephone Encounter (Signed)
Call in [place] #90 with 3 rf, also Tramadol #120 with 2 rf

## 2016-07-30 ENCOUNTER — Other Ambulatory Visit: Payer: Self-pay | Admitting: Family Medicine

## 2016-07-30 NOTE — Telephone Encounter (Signed)
Call in #30 with 5 rf 

## 2016-08-05 ENCOUNTER — Telehealth: Payer: Self-pay | Admitting: Family Medicine

## 2016-08-05 NOTE — Telephone Encounter (Signed)
I called pharmacy and they are getting the script ready, it was sent in on 07/30/2016 .

## 2016-08-05 NOTE — Telephone Encounter (Signed)
Pt needs refill on ambien 10 mg #30 w/refills

## 2016-08-31 ENCOUNTER — Other Ambulatory Visit: Payer: Self-pay | Admitting: Family Medicine

## 2016-09-01 NOTE — Telephone Encounter (Signed)
Can we refill this? 

## 2016-10-26 ENCOUNTER — Other Ambulatory Visit: Payer: Self-pay | Admitting: Family Medicine

## 2016-10-27 NOTE — Telephone Encounter (Signed)
Call in Alprazolam #90 with 5 rf, also Tramadol #120 with 5 rf

## 2016-11-25 ENCOUNTER — Encounter: Payer: Self-pay | Admitting: Adult Health

## 2016-11-25 ENCOUNTER — Ambulatory Visit (INDEPENDENT_AMBULATORY_CARE_PROVIDER_SITE_OTHER): Payer: 59 | Admitting: Adult Health

## 2016-11-25 VITALS — BP 142/80 | Temp 98.6°F | Ht 66.0 in | Wt 199.3 lb

## 2016-11-25 DIAGNOSIS — L249 Irritant contact dermatitis, unspecified cause: Secondary | ICD-10-CM | POA: Diagnosis not present

## 2016-11-25 MED ORDER — HYDROXYZINE HCL 25 MG PO TABS: 25.0000 mg | ORAL_TABLET | Freq: Every day | ORAL | 0 refills | Status: DC

## 2016-11-25 MED ORDER — METHYLPREDNISOLONE ACETATE 80 MG/ML IJ SUSP
80.0000 mg | Freq: Once | INTRAMUSCULAR | Status: AC
  Administered 2016-11-25: 80 mg via INTRAMUSCULAR

## 2016-11-25 NOTE — Progress Notes (Signed)
Subjective:    Patient ID: Stacy Hays, female    DOB: 03/21/1971, 45 y.o.   MRN: [id number]  HPI  45 year old female, patient of Dr. Sarajane Jews who  has a past medical history of Arthritis; Depression; Dysfunctional uterine bleeding; Headache(784.0); Heart murmur; Herniated disc; Insomnia; Overactive bladder; and Plantar fasciitis.  She presents to the office today with one week of rash throughout her body and severe itching. She reports that the rash started as a small " patch" on her upper right shoulder and then spread throughout the week to bilateral arms, stomach, back and legs.   She denies any changes in shampoos, soaps, detergents or diets. She has not started any new medications and has not been in the woods prior to this rash.   Denies any blisters, vesicles, discharge, SOB or CP.Marland Kitchen   She has been using benadryl and hydrocortisone creams  Review of Systems  Constitutional: Negative.   Respiratory: Negative.   Cardiovascular: Negative.   Skin: Positive for rash.  Hematological: Negative.   All other systems reviewed and are negative.  Past Medical History:  Diagnosis Date  . Arthritis   . Depression   . Dysfunctional uterine bleeding   . Headache(784.0)   . Heart murmur   . Herniated disc   . Insomnia   . Overactive bladder   . Plantar fasciitis     Social History   Social History  . Marital status: Married    Spouse name: N/A  . Number of children: N/A  . Years of education: N/A   Occupational History  . Not on file.   Social History Main Topics  . Smoking status: Never Smoker  . Smokeless tobacco: Never Used  . Alcohol use 0.0 oz/week     Comment: rare  . Drug use: No  . Sexual activity: Yes    Partners: Male   Other Topics Concern  . Not on file   Social History Narrative  . No narrative on file    Past Surgical History:  Procedure Laterality Date  . ABDOMINAL PLASTY    . ENDOMETRIAL ABLATION    . LEEP    . LUMBAR DISC SURGERY       Family History  Problem Relation Age of Onset  . Diabetes Mother 20  . Hyperlipidemia Mother 17  . Hypertension Mother   . Breast cancer Mother 13  . Heart disease Mother   . Cancer Mother   . Kidney disease    . Prostate cancer    . Sudden death    . Coronary artery disease    . Heart disease Maternal Grandfather   . Birth defects Paternal Grandmother   . Heart disease Paternal Grandfather   . Diabetes Paternal Grandfather     Allergies  Allergen Reactions  . Promethazine Hcl     REACTION: Dystonic reaction    Current Outpatient Prescriptions on File Prior to Visit  Medication Sig Dispense Refill  . ALPRAZolam (XANAX) 0.25 MG tablet TAKE 1 TABLET BY MOUTH THREE TIMES DAILY AS NEEDED 90 tablet 5  . CRYSELLE-28 0.3-30 MG-MCG tablet     . DULoxetine (CYMBALTA) 30 MG capsule TAKE ONE CAPSULE BY MOUTH EVERY DAY 30 capsule 11  . Misc Natural Products (GLUCOS-CHONDROIT-MSM COMPLEX PO) Take 1 tablet by mouth daily.      . Multiple Vitamin (MULTIVITAMIN) tablet Take 1 tablet by mouth daily.    . ondansetron (ZOFRAN) 8 MG tablet 8 mg as needed.     Marland Kitchen  SUMAtriptan (IMITREX) 100 MG tablet TAKE 1 TABLET BY MOUTH AS NEEDED FOR HEADACHE 10 tablet 11  . tolterodine (DETROL LA) 4 MG 24 hr capsule TAKE ONE CAPSULE BY MOUTH EVERY DAY 90 capsule 3  . traMADol (ULTRAM) 50 MG tablet TAKE 1 TABLET BY MOUTH EVERY 6 HOURS AS NEEDED 120 tablet 5  . trimethoprim-polymyxin b (POLYTRIM) ophthalmic solution Place 2 drops into the left eye 4 (four) times daily as needed. 10 mL 0  . vitamin C (ASCORBIC ACID) 500 MG tablet Take 500 mg by mouth daily.      Marland Kitchen VITAMIN D, CHOLECALCIFEROL, PO Take 1 capsule by mouth daily.      Marland Kitchen zolpidem (AMBIEN) 10 MG tablet TAKE 1 TABLET BY MOUTH EVERY DAY AT BEDTIME AS NEEDED 30 tablet 5  . SUMAtriptan-naproxen (TREXIMET) 85-500 MG per tablet Take 1 tablet by mouth every 2 (two) hours as needed for migraine. 3 tablet 0   No current facility-administered medications on  file prior to visit.     BP (!) 142/80   Temp 98.6 F (37 C) (Oral)   Ht 5\' 6"  (1.676 m)   Wt 199 lb 4.8 oz (90.4 kg)   BMI 32.17 kg/m       Objective:   Physical Exam  Constitutional: She is oriented to person, place, and time. She appears well-developed and well-nourished. No distress.  Cardiovascular: Normal rate, regular rhythm, normal heart sounds and intact distal pulses.  Exam reveals no gallop and no friction rub.   No murmur heard. Pulmonary/Chest: Effort normal and breath sounds normal. No respiratory distress. She has no wheezes. She has no rales. She exhibits no tenderness.  Neurological: She is alert and oriented to person, place, and time.  Skin: Skin is warm and dry. Rash noted. She is not diaphoretic. No erythema. No pallor.  Scattered erythremia throughout body. Small blisters noted. No drainage. + scratch marks   Psychiatric: She has a normal mood and affect. Her behavior is normal. Judgment and thought content normal.  Nursing note and vitals reviewed.     Assessment & Plan:  1. Irritant contact dermatitis, unspecified trigger - Appears as contact dermatitis from soap or detergent. She is going to start using hypoallergenic detergents, shampoos and lotions - hydrOXYzine (ATARAX/VISTARIL) 25 MG tablet; Take 1 tablet (25 mg total) by mouth at bedtime.  Dispense: 20 tablet; Refill: 0 - methylPREDNISolone acetate (DEPO-MEDROL) injection 80 mg; Inject 1 mL (80 mg total) into the muscle once. - Follow up if no improvement  Dorothyann Peng, NP

## 2016-11-27 ENCOUNTER — Telehealth: Payer: Self-pay | Admitting: Family Medicine

## 2016-11-27 NOTE — Telephone Encounter (Signed)
Pt was seen on 11-29 and the rash is worst. Please advise. Pt saw cory

## 2016-11-27 NOTE — Telephone Encounter (Signed)
Should patient come in to be re-evaluated?

## 2016-11-27 NOTE — Telephone Encounter (Signed)
She can be reevaluated if she would like or we can try a stronger hydrocortisone cream

## 2016-11-27 NOTE — Telephone Encounter (Signed)
Patient states that she called [facility] and has an appt today at 11:00. Patient states she will call back if she needs further assistance with this issue.

## 2016-12-29 ENCOUNTER — Encounter (HOSPITAL_COMMUNITY): Payer: Self-pay

## 2016-12-29 ENCOUNTER — Emergency Department (HOSPITAL_COMMUNITY)
Admission: EM | Admit: 2016-12-29 | Discharge: 2016-12-29 | Disposition: A | Discharge status: Home / Self Care | Payer: 59 | Attending: Emergency Medicine | Admitting: Emergency Medicine

## 2016-12-29 ENCOUNTER — Emergency Department (HOSPITAL_COMMUNITY): Payer: 59

## 2016-12-29 ENCOUNTER — Telehealth: Payer: Self-pay | Admitting: Family Medicine

## 2016-12-29 DIAGNOSIS — K529 Noninfective gastroenteritis and colitis, unspecified: Secondary | ICD-10-CM

## 2016-12-29 DIAGNOSIS — R1031 Right lower quadrant pain: Secondary | ICD-10-CM | POA: Diagnosis not present

## 2016-12-29 DIAGNOSIS — Z79899 Other long term (current) drug therapy: Secondary | ICD-10-CM | POA: Insufficient documentation

## 2016-12-29 DIAGNOSIS — R109 Unspecified abdominal pain: Secondary | ICD-10-CM | POA: Diagnosis not present

## 2016-12-29 DIAGNOSIS — M2550 Pain in unspecified joint: Secondary | ICD-10-CM

## 2016-12-29 LAB — COMPREHENSIVE METABOLIC PANEL
ALT: 23 U/L (ref 14–54)
AST: 22 U/L (ref 15–41)
Albumin: 3.7 g/dL (ref 3.5–5.0)
Alkaline Phosphatase: 48 U/L (ref 38–126)
Anion gap: 12 (ref 5–15)
BILIRUBIN TOTAL: 0.5 mg/dL (ref 0.3–1.2)
BUN: 14 mg/dL (ref 6–20)
CHLORIDE: 100 mmol/L — AB (ref 101–111)
CO2: 26 mmol/L (ref 22–32)
Calcium: 8.9 mg/dL (ref 8.9–10.3)
Creatinine, Ser: 0.87 mg/dL (ref 0.44–1.00)
Glucose, Bld: 125 mg/dL — ABNORMAL HIGH (ref 65–99)
POTASSIUM: 3.7 mmol/L (ref 3.5–5.1)
Sodium: 138 mmol/L (ref 135–145)
TOTAL PROTEIN: 6.2 g/dL — AB (ref 6.5–8.1)

## 2016-12-29 LAB — LIPASE, BLOOD: LIPASE: 30 U/L (ref 11–51)

## 2016-12-29 LAB — URINALYSIS, ROUTINE W REFLEX MICROSCOPIC
BILIRUBIN URINE: NEGATIVE
GLUCOSE, UA: NEGATIVE mg/dL
Ketones, ur: NEGATIVE mg/dL
LEUKOCYTES UA: NEGATIVE
NITRITE: NEGATIVE
Protein, ur: NEGATIVE mg/dL
SPECIFIC GRAVITY, URINE: 1.019 (ref 1.005–1.030)
pH: 5 (ref 5.0–8.0)

## 2016-12-29 LAB — CBC
HEMATOCRIT: 42.5 % (ref 36.0–46.0)
Hemoglobin: 14.6 g/dL (ref 12.0–15.0)
MCH: 30.4 pg (ref 26.0–34.0)
MCHC: 34.4 g/dL (ref 30.0–36.0)
MCV: 88.4 fL (ref 78.0–100.0)
PLATELETS: 332 10*3/uL (ref 150–400)
RBC: 4.81 MIL/uL (ref 3.87–5.11)
RDW: 14.7 % (ref 11.5–15.5)
WBC: 12.1 10*3/uL — AB (ref 4.0–10.5)

## 2016-12-29 MED ORDER — ONDANSETRON HCL 4 MG PO TABS: 4.0000 mg | ORAL_TABLET | Freq: Four times a day (QID) | ORAL | 0 refills | Status: DC

## 2016-12-29 MED ORDER — ONDANSETRON 4 MG PO TBDP
4.0000 mg | ORAL_TABLET | Freq: Once | ORAL | Status: AC
  Administered 2016-12-29: 4 mg via ORAL
  Filled 2016-12-29: qty 1

## 2016-12-29 MED ORDER — DICYCLOMINE HCL 10 MG PO CAPS
10.0000 mg | ORAL_CAPSULE | Freq: Once | ORAL | Status: AC
  Administered 2016-12-29: 10 mg via ORAL
  Filled 2016-12-29: qty 1

## 2016-12-29 MED ORDER — FENTANYL CITRATE (PF) 100 MCG/2ML IJ SOLN
50.0000 ug | Freq: Once | INTRAMUSCULAR | Status: AC
  Administered 2016-12-29: 50 ug via INTRAVENOUS
  Filled 2016-12-29: qty 2

## 2016-12-29 MED ORDER — ONDANSETRON HCL 4 MG/2ML IJ SOLN
4.0000 mg | Freq: Once | INTRAMUSCULAR | Status: AC
  Administered 2016-12-29: 4 mg via INTRAVENOUS
  Filled 2016-12-29: qty 2

## 2016-12-29 MED ORDER — DICYCLOMINE HCL 20 MG PO TABS: 20.0000 mg | ORAL_TABLET | Freq: Two times a day (BID) | ORAL | 0 refills | Status: DC

## 2016-12-29 MED ORDER — HYDROCODONE-ACETAMINOPHEN 5-325 MG PO TABS: 1.0000 | ORAL_TABLET | ORAL | 0 refills | Status: DC | PRN

## 2016-12-29 MED ORDER — HYDROMORPHONE HCL 2 MG/ML IJ SOLN: 0.5000 mg | INTRAMUSCULAR | Status: DC | PRN

## 2016-12-29 MED ORDER — SODIUM CHLORIDE 0.9 % IV BOLUS (SEPSIS)
1000.0000 mL | Freq: Once | INTRAVENOUS | Status: AC
  Administered 2016-12-29: 1000 mL via INTRAVENOUS

## 2016-12-29 NOTE — Telephone Encounter (Signed)
The referral was done  

## 2016-12-29 NOTE — ED Provider Notes (Signed)
[facility] DEPT Provider Note   CSN: [id number] Arrival date & time: 12/29/16  0038  History   Chief Complaint Chief Complaint  Patient presents with  . Abdominal Pain    HPI Stacy Hays is a 46 y.o. female.  HPI   Patient has PMH of arthritis, depression, dysfunctional uterine bleeding, heart murmur, headache, herniated disc, insomnia, plantar fasciitis comes to the ER with compliants of acute onset of severe right flank pain that radiates to her groin that started at 11:45 am. She is diaphoretic and unable to sit still due to the severity of her pain. She has had 5 episodes of vomiting as well. Denies SOB, vaginal discharge or bleeding, has not noticed hematuria. She has had "kidney pain" and burning with odor to her urine. Denies fever.  Past Medical History:  Diagnosis Date  . Arthritis   . Depression   . Dysfunctional uterine bleeding   . Headache(784.0)   . Heart murmur   . Herniated disc   . Insomnia   . Overactive bladder   . Plantar fasciitis     Patient Active Problem List   Diagnosis Date Noted  . Anxiety state 05/16/2014  . VIRAL URI 01/19/2011  . GLOSSITIS 01/19/2011  . OBESITY 08/21/2010  . DYSFUNCTIONAL UTERINE BLEEDING 02/04/2010  . UTI 12/10/2009  . CELLULITIS AND ABSCESS OF FACE 02/11/2009  . INSOMNIA 01/31/2009  . Depressive disorder, not elsewhere classified 11/17/2007  . OSTEOARTHRITIS 11/17/2007  . Myalgia and myositis 11/17/2007  . SEIZURES, FEBRILE 11/17/2007  . HEADACHE 11/17/2007  . INCONTINENCE WITHOUT SENSORY AWARENESS 11/17/2007  . CARDIAC MURMUR, HX OF 11/17/2007  . CHICKENPOX, HX OF 11/17/2007    Past Surgical History:  Procedure Laterality Date  . ABDOMINAL PLASTY    . ENDOMETRIAL ABLATION    . LEEP    . LUMBAR DISC SURGERY      OB History    No data available       Home Medications    Prior to Admission medications   Medication Sig Start Date End Date Taking? Authorizing Provider  DULoxetine (CYMBALTA) 30 MG  capsule TAKE ONE CAPSULE BY MOUTH EVERY DAY 03/30/16  Yes Laurey Morale, MD  fluocinonide ointment (LIDEX) [id number] % Apply 1 application topically 4 (four) times daily.  12/16/16  Yes Historical Provider, MD  hydrOXYzine (ATARAX/VISTARIL) 10 MG tablet Take 30 mg by mouth at bedtime. 12/09/16  Yes Historical Provider, MD  Magnesium 250 MG TABS Take 250 mg by mouth daily.    Yes Historical Provider, MD  MICROGESTIN FE 1/20 1-20 MG-MCG tablet Take 1 tablet by mouth daily. 12/04/16  Yes Historical Provider, MD  Misc Natural Products (GLUCOS-CHONDROIT-MSM COMPLEX PO) Take 1 tablet by mouth daily.     Yes Historical Provider, MD  Multiple Vitamin (MULTIVITAMIN) tablet Take 1 tablet by mouth daily.   Yes Historical Provider, MD  predniSONE (DELTASONE) 10 MG tablet Take 10 mg by mouth daily. 12/09/16  Yes Historical Provider, MD  tolterodine (DETROL LA) 4 MG 24 hr capsule TAKE ONE CAPSULE BY MOUTH EVERY DAY 04/27/16  Yes Laurey Morale, MD  traMADol (ULTRAM) 50 MG tablet TAKE 1 TABLET BY MOUTH EVERY 6 HOURS AS NEEDED 10/28/16  Yes Laurey Morale, MD  VITAMIN D, CHOLECALCIFEROL, PO Take 1 capsule by mouth daily.     Yes Historical Provider, MD  zolpidem (AMBIEN) 10 MG tablet TAKE 1 TABLET BY MOUTH EVERY DAY AT BEDTIME AS NEEDED 07/30/16  Yes Laurey Morale, MD  ALPRAZolam (  XANAX) 0.25 MG tablet TAKE 1 TABLET BY MOUTH THREE TIMES DAILY AS NEEDED Patient not taking: Reported on 12/29/2016 10/28/16   Laurey Morale, MD  dicyclomine (BENTYL) 20 MG tablet Take 1 tablet (20 mg total) by mouth 2 (two) times daily. 12/29/16   Delos Haring, PA-C  HYDROcodone-acetaminophen (NORCO/VICODIN) 5-325 MG tablet Take 1-2 tablets by mouth every 4 (four) hours as needed. 12/29/16   Kadden Osterhout Carlota Raspberry, PA-C  hydrOXYzine (ATARAX/VISTARIL) 25 MG tablet Take 1 tablet (25 mg total) by mouth at bedtime. Patient not taking: Reported on 12/29/2016 11/25/16   Dorothyann Peng, NP  ondansetron (ZOFRAN) 4 MG tablet Take 1 tablet (4 mg total) by mouth every 6 (six)  hours. 12/29/16   Josel Keo Carlota Raspberry, PA-C  SUMAtriptan (IMITREX) 100 MG tablet TAKE 1 TABLET BY MOUTH AS NEEDED FOR HEADACHE Patient not taking: Reported on 12/29/2016 09/02/16   Laurey Morale, MD  SUMAtriptan-naproxen (TREXIMET) 85-500 MG per tablet Take 1 tablet by mouth every 2 (two) hours as needed for migraine. Patient not taking: Reported on 12/29/2016 07/14/11 12/29/16  Olegario Messier, NP  trimethoprim-polymyxin b (POLYTRIM) ophthalmic solution Place 2 drops into the left eye 4 (four) times daily as needed. Patient not taking: Reported on 12/29/2016 08/20/15   Laurey Morale, MD    Family History Family History  Problem Relation Age of Onset  . Diabetes Mother 7  . Hyperlipidemia Mother 14  . Hypertension Mother   . Breast cancer Mother 23  . Heart disease Mother   . Cancer Mother   . Kidney disease    . Prostate cancer    . Sudden death    . Coronary artery disease    . Heart disease Maternal Grandfather   . Birth defects Paternal Grandmother   . Heart disease Paternal Grandfather   . Diabetes Paternal Grandfather     Social History Social History  Substance Use Topics  . Smoking status: Never Smoker  . Smokeless tobacco: Never Used  . Alcohol use 0.0 oz/week     Comment: rare     Allergies   Promethazine hcl   Review of Systems Review of Systems Review of Systems All other systems negative except as documented in the HPI. All pertinent positives and negatives as reviewed in the HPI.   Physical Exam Updated Vital Signs BP 119/87 (BP Location: Right Arm)   Pulse 92   Temp 97.6 F (36.4 C) (Oral)   Resp 18   SpO2 98%   Physical Exam  Constitutional: She appears well-developed and well-nourished. She appears distressed.  HENT:  Head: Normocephalic and atraumatic.  Mouth/Throat: Uvula is midline, oropharynx is clear and moist and mucous membranes are normal.  Eyes: Pupils are equal, round, and reactive to light.  Neck: Normal range of motion. Neck supple.    Cardiovascular: Normal rate and regular rhythm.   Pulmonary/Chest: Effort normal.  Abdominal: Soft.  No signs of abdominal distention Exam limited by body habitus pt patient is diffusely tender with pain most localized to the right flank, RLQ and suprapubic region.  Musculoskeletal:  No LE swelling No paraspinal back tenderness to lumbar or thoracic spine on palpation. No rash.  Neurological: She is alert.  Acting at baseline  Skin: Skin is warm. No rash noted. She is diaphoretic.  Nursing note and vitals reviewed.    ED Treatments / Results  Labs (all labs ordered are listed, but only abnormal results are displayed) Labs Reviewed  COMPREHENSIVE METABOLIC PANEL - Abnormal; Notable for the  following:       Result Value   Chloride 100 (*)    Glucose, Bld 125 (*)    Total Protein 6.2 (*)    All other components within normal limits  CBC - Abnormal; Notable for the following:    WBC 12.1 (*)    All other components within normal limits  URINALYSIS, ROUTINE W REFLEX MICROSCOPIC - Abnormal; Notable for the following:    Hgb urine dipstick MODERATE (*)    Bacteria, UA FEW (*)    Squamous Epithelial / LPF 0-5 (*)    All other components within normal limits  LIPASE, BLOOD    EKG  EKG Interpretation None       Radiology Ct Abdomen Pelvis Wo Contrast  Result Date: 12/29/2016 CLINICAL DATA:  Sudden onset right lower quadrant pain, intermittent for 1 hour. EXAM: CT ABDOMEN AND PELVIS WITHOUT CONTRAST TECHNIQUE: Multidetector CT imaging of the abdomen and pelvis was performed following the standard protocol without IV contrast. COMPARISON:  None. FINDINGS: Lower chest: No acute abnormality. Hepatobiliary: No focal liver abnormality is seen. No gallstones, gallbladder wall thickening, or biliary dilatation. Pancreas: Unremarkable. No pancreatic ductal dilatation or surrounding inflammatory changes. Spleen: Normal in size without focal abnormality. Adrenals/Urinary Tract: Adrenal  glands are unremarkable. Kidneys are normal, without renal calculi, focal lesion, or hydronephrosis. Bladder is unremarkable. Stomach/Bowel: Small hiatal hernia.  Otherwise normal stomach. The appendix is normal. The small bowel and colon are distended with fluid. There is no evidence of bowel obstruction. No extraluminal air. No focal inflammation involving bowel. Vascular/Lymphatic: No significant vascular findings are present. No enlarged abdominal or pelvic lymph nodes. Reproductive: Uterus and bilateral adnexa are unremarkable. Other: No ascites. Musculoskeletal: No significant skeletal lesions.   IMPRESSION: Normal appendix. Moderate fluid distention of small and large bowel without focal inflammation and without obstruction. Small hiatal hernia. Electronically Signed   By: Andreas Newport M.D.   On: 12/29/2016 01:59    Procedures Procedures (including critical care time)  Medications Ordered in ED Medications  HYDROmorphone (DILAUDID) injection 0.5 mg (not administered)  ondansetron (ZOFRAN-ODT) disintegrating tablet 4 mg (not administered)  dicyclomine (BENTYL) capsule 10 mg (not administered)  fentaNYL (SUBLIMAZE) injection 50 mcg (50 mcg Intravenous Given 12/29/16 0134)  ondansetron (ZOFRAN) injection 4 mg (4 mg Intravenous Given 12/29/16 0134)  sodium chloride 0.9 % bolus 1,000 mL (1,000 mLs Intravenous New Bag/Given 12/29/16 0139)     Initial Impression / Assessment and Plan / ED Course  I have reviewed the triage vital signs and the nursing notes.  Pertinent labs & imaging results that were available during my care of the patient were reviewed by me and considered in my medical decision making (see chart for details).  Clinical Course     I discussed the CT results with Dr. Bebe Shaggy the radiologist, the pain was likely coming from diarrhea backed up behind formed stool. She has some inflammation there but no obstruction or diverticulitis.   Patient with symptoms consistent with  viral gastroenteritis.  Vitals are stable, no fever.  No signs of dehydration, tolerating PO fluids > 6 oz.  Lungs are clear.  No focal abdominal pain, no concern for appendicitis, cholecystitis, pancreatitis, ruptured viscus, UTI, kidney stone, or any other abdominal etiology.  Supportive therapy indicated with return if symptoms worsen.  Patient counseled.   Final Clinical Impressions(s) / ED Diagnoses   Final diagnoses:  Enteritis    New Prescriptions New Prescriptions   DICYCLOMINE (BENTYL) 20 MG TABLET  Take 1 tablet (20 mg total) by mouth 2 (two) times daily.   HYDROCODONE-ACETAMINOPHEN (NORCO/VICODIN) 5-325 MG TABLET    Take 1-2 tablets by mouth every 4 (four) hours as needed.   ONDANSETRON (ZOFRAN) 4 MG TABLET    Take 1 tablet (4 mg total) by mouth every 6 (six) hours.     Delos Haring, PA-C 12/29/16 [place], DO 12/29/16 [id number]

## 2016-12-29 NOTE — ED Triage Notes (Signed)
Pt states about 1 hour ago she started having sever sharp RLQ pain that comes and goes; pt c/of n/v; Pt crying out in pain at triage; pt a&ox 4 on arrival. Pt states still has appendix; pt states back surgery in the past; pt states pain goes into back;  Pt states 5 x emesis since episode started

## 2016-12-29 NOTE — ED Notes (Signed)
Pt presents with RLQ pain, sts her abdomen is rigid, she is nauseated and has been vomiting x5 since this started.

## 2016-12-29 NOTE — Telephone Encounter (Signed)
Patient would like to have a referral to rheumatoid specialist. [facility] Rheumatologist Dr. Amil Amen [place]. Patient states she is still having skin issues and other things.  Contact Info: [phone]

## 2016-12-29 NOTE — Telephone Encounter (Signed)
I spoke with pt and gave below information.  

## 2017-01-04 DIAGNOSIS — R21 Rash and other nonspecific skin eruption: Secondary | ICD-10-CM | POA: Diagnosis not present

## 2017-01-04 DIAGNOSIS — M2550 Pain in unspecified joint: Secondary | ICD-10-CM | POA: Diagnosis not present

## 2017-01-04 DIAGNOSIS — M1990 Unspecified osteoarthritis, unspecified site: Secondary | ICD-10-CM | POA: Diagnosis not present

## 2017-01-07 ENCOUNTER — Other Ambulatory Visit: Payer: Self-pay | Admitting: Obstetrics and Gynecology

## 2017-01-07 DIAGNOSIS — Z1231 Encounter for screening mammogram for malignant neoplasm of breast: Secondary | ICD-10-CM

## 2017-01-18 DIAGNOSIS — R21 Rash and other nonspecific skin eruption: Secondary | ICD-10-CM | POA: Diagnosis not present

## 2017-01-18 DIAGNOSIS — M1990 Unspecified osteoarthritis, unspecified site: Secondary | ICD-10-CM | POA: Diagnosis not present

## 2017-01-18 DIAGNOSIS — M2550 Pain in unspecified joint: Secondary | ICD-10-CM | POA: Diagnosis not present

## 2017-01-25 ENCOUNTER — Other Ambulatory Visit: Payer: Self-pay | Admitting: Family Medicine

## 2017-01-26 NOTE — Telephone Encounter (Signed)
Call in #30 with 5 rf 

## 2017-02-01 DIAGNOSIS — R21 Rash and other nonspecific skin eruption: Secondary | ICD-10-CM | POA: Diagnosis not present

## 2017-02-10 ENCOUNTER — Ambulatory Visit
Admission: RE | Admit: 2017-02-10 | Discharge: 2017-02-10 | Disposition: A | Discharge status: Home / Self Care | Payer: 59 | Source: Ambulatory Visit | Attending: Obstetrics and Gynecology | Admitting: Obstetrics and Gynecology

## 2017-02-10 DIAGNOSIS — Z1231 Encounter for screening mammogram for malignant neoplasm of breast: Secondary | ICD-10-CM

## 2017-02-17 DIAGNOSIS — R311 Benign essential microscopic hematuria: Secondary | ICD-10-CM | POA: Diagnosis not present

## 2017-03-01 DIAGNOSIS — R21 Rash and other nonspecific skin eruption: Secondary | ICD-10-CM | POA: Diagnosis not present

## 2017-03-01 DIAGNOSIS — W57XXXD Bitten or stung by nonvenomous insect and other nonvenomous arthropods, subsequent encounter: Secondary | ICD-10-CM | POA: Diagnosis not present

## 2017-03-24 ENCOUNTER — Other Ambulatory Visit: Payer: Self-pay | Admitting: Family Medicine

## 2017-04-07 ENCOUNTER — Other Ambulatory Visit: Payer: Self-pay | Admitting: Obstetrics and Gynecology

## 2017-04-07 DIAGNOSIS — Z124 Encounter for screening for malignant neoplasm of cervix: Secondary | ICD-10-CM | POA: Diagnosis not present

## 2017-04-07 DIAGNOSIS — Z1329 Encounter for screening for other suspected endocrine disorder: Secondary | ICD-10-CM | POA: Diagnosis not present

## 2017-04-07 DIAGNOSIS — Z01419 Encounter for gynecological examination (general) (routine) without abnormal findings: Secondary | ICD-10-CM | POA: Diagnosis not present

## 2017-04-08 LAB — CYTOLOGY - PAP

## 2017-04-19 ENCOUNTER — Encounter: Payer: Self-pay | Admitting: Family Medicine

## 2017-04-19 ENCOUNTER — Ambulatory Visit (INDEPENDENT_AMBULATORY_CARE_PROVIDER_SITE_OTHER): Payer: 59 | Admitting: Family Medicine

## 2017-04-19 VITALS — BP 132/88 | HR 92 | Temp 98.7°F | Wt 209.8 lb

## 2017-04-19 DIAGNOSIS — R0602 Shortness of breath: Secondary | ICD-10-CM

## 2017-04-19 NOTE — Patient Instructions (Signed)
WE NOW OFFER   Stacy [facility] FAST TRACK!!!  SAME DAY Appointments for ACUTE CARE  Such as: Sprains, Injuries, cuts, abrasions, rashes, muscle pain, joint pain, back pain Colds, flu, sore throats, headache, allergies, cough, fever  Ear pain, sinus and eye infections Abdominal pain, nausea, vomiting, diarrhea, upset stomach Animal/insect bites  3 Easy Ways to Schedule: Walk-In Scheduling Call in scheduling Mychart Sign-up: [email or web address]         

## 2017-04-19 NOTE — Addendum Note (Signed)
Addended by: Alysia Penna A on: 04/19/2017 10:47 AM   Modules accepted: Orders

## 2017-04-19 NOTE — Progress Notes (Signed)
Pre visit review using our clinic review tool, if applicable. No additional management support is needed unless otherwise documented below in the visit note. 

## 2017-04-19 NOTE — Progress Notes (Signed)
   Subjective:    Patient ID: Stacy Hays, female    DOB: 1971/08/31, 46 y.o.   MRN: [id number]  HPI Here for several months of intermittent SOB and chest pressure during exercise. No nausea or sweats. No GERD symptoms. She also feels her heart racing sometimes at rest. She know she has put on some weight in the last year and this may play a role. She had comprehensive labs done in January which were unremarkable, including a normal Hgb. She had a thyroid panel run recently by her GYN which was normal. She is very concerned that something may be wrong with her heart.    Review of Systems  Constitutional: Negative.   Respiratory: Positive for chest tightness and shortness of breath. Negative for cough and wheezing.   Cardiovascular: Positive for palpitations. Negative for chest pain and leg swelling.  Neurological: Negative.        Objective:   Physical Exam  Constitutional: She is oriented to person, place, and time. She appears well-developed and well-nourished. No distress.  Neck: No thyromegaly present.  Cardiovascular: Normal rate, regular rhythm, normal heart sounds and intact distal pulses.   No murmur heard. Pulmonary/Chest: Effort normal and breath sounds normal. No respiratory distress. She has no wheezes. She has no rales.  Lymphadenopathy:    She has no cervical adenopathy.  Neurological: She is alert and oriented to person, place, and time.          Assessment & Plan:  SOB on exertion. Per her wishes we will refer her to Cardiology to evaluate further. She will ask her husband who he wants her to see.  Alysia Penna, MD

## 2017-04-26 ENCOUNTER — Other Ambulatory Visit: Payer: Self-pay | Admitting: Family Medicine

## 2017-04-27 NOTE — Progress Notes (Signed)
Cardiology Office Note    Date:  04/28/2017   ID:  ARDIE DRAGOO, DOB Nov 26, 1971, MRN [id number]  PCP:  Alysia Penna, MD  Cardiologist:  New  CC: CP/SOB  Stacy Hays is a 46 y.o. female who is being seen today for the evaluation of chest pain/ shortness of breath at the request of Laurey Morale, MD.   History of Present Illness:  Stacy Hays is a 46 y.o. female with a history of obesity, despression and dysfunctional uterine bleeding who presents to clinic for evaluation of chest pain and sob.   She had a pruritic diffuse rash all over her body that started last November and has had a big work up and many empiric therapies. All blood work as been good and autoimmune panels negative.   More recently she has felt heart palpitations. It seems to get worse with stress. She also has noted some exertional dyspnea and chest tightness while walking up an incline recently. No radiation. It was associated with a cough. No diaphoresis or nausea. This was about 1 month ago and it has not recurred since but she has not exerted herself.   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 No CP or SOB. No LE edema, orthopnea or PND. No dizziness or syncope. No blood in stool or urine.   Her maternal GF had massive heart in 71s. Her mother had stents placed in her 26s and ended up and having bypass surgery in her 69s. Her mother never showed classic symptoms and always passed her stress tests. She does not smoke, drinks occasionally and no illicit drugs.     Past Medical History:  Diagnosis Date  . Arthritis   . Depression   . Dysfunctional uterine bleeding   . Headache(784.0)   . Heart murmur   . Herniated disc   . Insomnia   . Overactive bladder   . Plantar fasciitis     Past Surgical History:  Procedure Laterality Date  . ABDOMINAL PLASTY    . BREAST BIOPSY Right 2013  . ENDOMETRIAL ABLATION     . LEEP    . LUMBAR DISC SURGERY      Current Medications: Outpatient Medications Prior to Visit  Medication Sig Dispense Refill  . ALPRAZolam (XANAX) 0.25 MG tablet TAKE 1 TABLET BY MOUTH THREE TIMES DAILY AS NEEDED 90 tablet 5  . DULoxetine (CYMBALTA) 30 MG capsule TAKE ONE CAPSULE BY MOUTH EVERY DAY 30 capsule 11  . HYDROcodone-acetaminophen (NORCO/VICODIN) 5-325 MG tablet Take 1-2 tablets by mouth every 4 (four) hours as needed. 20 tablet 0  . Magnesium 250 MG TABS Take 250 mg by mouth daily.     . Multiple Vitamin (MULTIVITAMIN) tablet Take 1 tablet by mouth daily.    . ondansetron (ZOFRAN) 4 MG tablet Take 1 tablet (4 mg total) by mouth every 6 (six) hours. 12 tablet 0  . SUMAtriptan (IMITREX) 100 MG tablet TAKE 1 TABLET BY MOUTH AS NEEDED FOR HEADACHE 10 tablet 11  . SUMAtriptan-naproxen (TREXIMET) 85-500 MG per tablet Take 1 tablet by mouth every 2 (two) hours as needed for migraine. 3 tablet 0  . tolterodine (DETROL LA) 4 MG 24 hr capsule TAKE ONE CAPSULE BY MOUTH EVERY DAY 90 capsule 0  . traMADol (ULTRAM) 50 MG tablet TAKE 1 TABLET BY MOUTH EVERY 6 HOURS AS NEEDED 120 tablet 5  .  trimethoprim-polymyxin b (POLYTRIM) ophthalmic solution Place 2 drops into the left eye 4 (four) times daily as needed. 10 mL 0  . VITAMIN D, CHOLECALCIFEROL, PO Take 1 capsule by mouth daily.      Marland Kitchen zolpidem (AMBIEN) 10 MG tablet TAKE 1 TABLET BY MOUTH EVERY NIGHT AT BEDTIME 30 tablet 5  . Misc Natural Products (GLUCOS-CHONDROIT-MSM COMPLEX PO) Take 1 tablet by mouth daily.      Marland Kitchen dicyclomine (BENTYL) 20 MG tablet Take 1 tablet (20 mg total) by mouth 2 (two) times daily. (Patient not taking: Reported on 04/28/2017) 20 tablet 0  . MICROGESTIN FE 1/20 1-20 MG-MCG tablet Take 1 tablet by mouth daily.    . predniSONE (DELTASONE) 10 MG tablet Take 10 mg by mouth daily.     No facility-administered medications prior to visit.      Allergies:   Promethazine and Promethazine hcl   Social History   Social  History  . Marital status: Married    Spouse name: N/A  . Number of children: N/A  . Years of education: N/A   Social History Main Topics  . Smoking status: Never Smoker  . Smokeless tobacco: Never Used  . Alcohol use 0.0 oz/week     Comment: rare  . Drug use: No  . Sexual activity: Yes    Partners: Male   Other Topics Concern  . None   Social History Narrative  . None     Family History:  The patient's family history includes Birth defects in her paternal grandmother; Breast cancer in her paternal grandmother; Breast cancer (age of onset: 64) in her mother; Cancer in her mother; Diabetes in her paternal grandfather; Diabetes (age of onset: 19) in her mother; Heart disease in her maternal grandfather, mother, and paternal grandfather; Hyperlipidemia (age of onset: 71) in her mother; Hypertension in her mother.      ROS:   Please see the history of present illness.    ROS All other systems reviewed and are negative.   PHYSICAL EXAM:   VS:  BP 132/82   Pulse 93   Ht 5\' 6"  (1.676 m)   Wt 209 lb 1.9 oz (94.9 kg)   BMI 33.75 kg/m    GEN: Well nourished, well developed, in no acute distress  HEENT: normal  Neck: no JVD, carotid bruits, or masses Cardiac: RRR; no murmurs, rubs, or gallops,no edema  Respiratory:  clear to auscultation bilaterally, normal work of breathing GI: soft, nontender, nondistended, + BS MS: no deformity or atrophy  Skin: warm and dry, no rash Neuro:  Alert and Oriented x 3, Strength and sensation are intact Psych: euthymic mood, full affect    Wt Readings from Last 3 Encounters:  04/28/17 209 lb 1.9 oz (94.9 kg)  04/19/17 209 lb 12.8 oz (95.2 kg)  11/25/16 199 lb 4.8 oz (90.4 kg)      Studies/Labs Reviewed:   EKG:  EKG is ordered today.  The ekg ordered today demonstrates NSR with HR 93  Recent Labs: 12/29/2016: ALT 23; BUN 14; Creatinine, Ser 0.87; Hemoglobin 14.6; Platelets 332; Potassium 3.7; Sodium 138   Lipid Panel    Component  Value Date/Time   CHOL 204 (H) 11/07/2013 1113   TRIG 89.0 11/07/2013 1113   HDL 55.30 11/07/2013 1113   CHOLHDL 4 11/07/2013 1113   VLDL 17.8 11/07/2013 1113   LDLCALC 84 05/27/2011 1535   LDLDIRECT 138.1 11/07/2013 1113    Additional studies/ records that were reviewed today include:  none  ASSESSMENT & PLAN:   Chest pain/DOE: she had one episode of chest pain and DOE. She has not had any since, but is not very active. I have recommended that she get a ETT myoview for further risk stratification.   HLD: she had mildly elevated lipids in the past, but last checked in 2014. I will check a lipid panel and if elevated I will recommend a statin given strong family history of early CAD. Also, discussed getting a calcium score. This would help guide [place] on how aggressive to be with lipid lowering   Obesity: Body mass index is 33.75 kg/m. She is interesting in starting the ketogenic diet. I encouraged her to start exercising and diet    Medication Adjustments/Labs and Tests Ordered: Current medicines are reviewed at length with the patient today.  Concerns regarding medicines are outlined above.  Medication changes, Labs and Tests ordered today are listed in the Patient Instructions below. Patient Instructions  Medication Instructions:  Your physician recommends that you continue on your current medications as directed. Please refer to the Current Medication list given to you today.   Labwork: AT YOUR CONVENIENCE (SOON) FASTING LIPID  Testing/Procedures: Your physician has requested that you have en exercise stress myoview. For further information please visit [email or web address]. Please follow instruction sheet, as given.   Follow-Up: Your physician recommends that you schedule a follow-up appointment in: DEPENDING UPON TEST RESULTS   Any Other Special Instructions Will Be Listed Below (If Applicable).   Exercise Stress Electrocardiogram An exercise stress electrocardiogram  is a test to check how blood flows to your heart. It is done to find areas of poor blood flow. You will need to walk on a treadmill for this test. The electrocardiogram will record your heartbeat when you are at rest and when you are exercising. What happens before the procedure?  Do not have drinks with caffeine or foods with caffeine for 24 hours before the test, or as told by your doctor. This includes coffee, tea (even decaf tea), sodas, chocolate, and cocoa.  Follow your doctor's instructions about eating and drinking before the test.  Ask your doctor what medicines you should or should not take before the test. Take your medicines with water unless told by your doctor not to.  If you use an inhaler, bring it with you to the test.  Bring a snack to eat after the test.  Do not  smoke for 4 hours before the test.  Do not put lotions, powders, creams, or oils on your chest before the test.  Wear comfortable shoes and clothing. What happens during the procedure?  You will have patches put on your chest. Small areas of your chest may need to be shaved. Wires will be connected to the patches.  Your heart rate will be watched while you are resting and while you are exercising.  You will walk on the treadmill. The treadmill will slowly get faster to raise your heart rate.  The test will take about 1-2 hours. What happens after the procedure?  Your heart rate and blood pressure will be watched after the test.  You may return to your normal diet, activities, and medicines or as told by your doctor. This information is not intended to replace advice given to you by your health care provider. Make sure you discuss any questions you have with your health care provider. Document Released: 06/01/2008 Document Revised: 08/12/2016 Document Reviewed: 08/21/2013 Elsevier Interactive Patient Education  2017 [employer].  If you need a refill on your cardiac medications before your next  appointment, please call your pharmacy.      Signed, Angelena Form, PA-C  04/28/2017 3:05 PM    [facility] [place], [place], [place]  [address] Phone: [phone]; Fax: [phone]

## 2017-04-28 ENCOUNTER — Encounter: Payer: Self-pay | Admitting: Physician Assistant

## 2017-04-28 ENCOUNTER — Ambulatory Visit (INDEPENDENT_AMBULATORY_CARE_PROVIDER_SITE_OTHER): Payer: 59 | Admitting: Physician Assistant

## 2017-04-28 VITALS — BP 132/82 | HR 93 | Ht 66.0 in | Wt 209.1 lb

## 2017-04-28 DIAGNOSIS — E669 Obesity, unspecified: Secondary | ICD-10-CM

## 2017-04-28 DIAGNOSIS — R079 Chest pain, unspecified: Secondary | ICD-10-CM | POA: Diagnosis not present

## 2017-04-28 DIAGNOSIS — E785 Hyperlipidemia, unspecified: Secondary | ICD-10-CM

## 2017-04-28 NOTE — Patient Instructions (Signed)
Medication Instructions:  Your physician recommends that you continue on your current medications as directed. Please refer to the Current Medication list given to you today.   Labwork: AT YOUR CONVENIENCE (SOON) FASTING LIPID  Testing/Procedures: Your physician has requested that you have en exercise stress myoview. For further information please visit [email or web address]. Please follow instruction sheet, as given.   Follow-Up: Your physician recommends that you schedule a follow-up appointment in: DEPENDING UPON TEST RESULTS   Any Other Special Instructions Will Be Listed Below (If Applicable).   Exercise Stress Electrocardiogram An exercise stress electrocardiogram is a test to check how blood flows to your heart. It is done to find areas of poor blood flow. You will need to walk on a treadmill for this test. The electrocardiogram will record your heartbeat when you are at rest and when you are exercising. What happens before the procedure?  Do not have drinks with caffeine or foods with caffeine for 24 hours before the test, or as told by your doctor. This includes coffee, tea (even decaf tea), sodas, chocolate, and cocoa.  Follow your doctor's instructions about eating and drinking before the test.  Ask your doctor what medicines you should or should not take before the test. Take your medicines with water unless told by your doctor not to.  If you use an inhaler, bring it with you to the test.  Bring a snack to eat after the test.  Do not  smoke for 4 hours before the test.  Do not put lotions, powders, creams, or oils on your chest before the test.  Wear comfortable shoes and clothing. What happens during the procedure?  You will have patches put on your chest. Small areas of your chest may need to be shaved. Wires will be connected to the patches.  Your heart rate will be watched while you are resting and while you are exercising.  You will walk on the treadmill.  The treadmill will slowly get faster to raise your heart rate.  The test will take about 1-2 hours. What happens after the procedure?  Your heart rate and blood pressure will be watched after the test.  You may return to your normal diet, activities, and medicines or as told by your doctor. This information is not intended to replace advice given to you by your health care provider. Make sure you discuss any questions you have with your health care provider. Document Released: 06/01/2008 Document Revised: 08/12/2016 Document Reviewed: 08/21/2013 Elsevier Interactive Patient Education  2017 [employer].    If you need a refill on your cardiac medications before your next appointment, please call your pharmacy.

## 2017-04-29 ENCOUNTER — Telehealth: Payer: Self-pay | Admitting: Physician Assistant

## 2017-04-29 NOTE — Telephone Encounter (Signed)
Returned pts call and advised her that she should go ahead and have the stress test as recommended and pt is agreeable to that plan.

## 2017-04-29 NOTE — Telephone Encounter (Signed)
New message      Pt was seen yesterday.  She was mistaken on when her mom had her first cardiac event.  Her mom was in her early 47's when she had her first event.  Patient thought she was much younger.  Will pt still need to have her nuclear stress test based on this new information?

## 2017-04-29 NOTE — Telephone Encounter (Signed)
That does make it a little less worrisome, but I think we ought to still get stress test.

## 2017-04-30 DIAGNOSIS — N3001 Acute cystitis with hematuria: Secondary | ICD-10-CM | POA: Diagnosis not present

## 2017-04-30 DIAGNOSIS — R350 Frequency of micturition: Secondary | ICD-10-CM | POA: Diagnosis not present

## 2017-05-11 ENCOUNTER — Other Ambulatory Visit: Payer: Self-pay | Admitting: Family Medicine

## 2017-05-12 ENCOUNTER — Telehealth (HOSPITAL_COMMUNITY): Payer: Self-pay | Admitting: *Deleted

## 2017-05-12 NOTE — Telephone Encounter (Signed)
Call in #[phone] with 5 rf 

## 2017-05-12 NOTE — Telephone Encounter (Signed)
Left message on voicemail per DPR in reference to upcoming appointment scheduled on 05/17/17 with detailed instructions given per Myocardial Perfusion Study Information Sheet for the test. LM to arrive 15 minutes early, and that it is imperative to arrive on time for appointment to keep from having the test rescheduled. If you need to cancel or reschedule your appointment, please call the office within 24 hours of your appointment. Failure to do so may result in a cancellation of your appointment, and a $50 no show fee. Phone number given for call back for any questions.Kirstie Peri

## 2017-05-17 ENCOUNTER — Other Ambulatory Visit: Payer: 59

## 2017-05-17 ENCOUNTER — Ambulatory Visit (HOSPITAL_COMMUNITY): Payer: 59 | Attending: Cardiology

## 2017-05-17 DIAGNOSIS — E785 Hyperlipidemia, unspecified: Secondary | ICD-10-CM

## 2017-05-17 DIAGNOSIS — R079 Chest pain, unspecified: Secondary | ICD-10-CM | POA: Diagnosis not present

## 2017-05-17 LAB — LIPID PANEL
Chol/HDL Ratio: 3.1 ratio (ref 0.0–4.4)
Cholesterol, Total: 186 mg/dL (ref 100–199)
HDL: 60 mg/dL (ref >39)
LDL CALC: 112 mg/dL — AB (ref 0–99)
Triglycerides: 72 mg/dL (ref 0–149)
VLDL CHOLESTEROL CAL: 14 mg/dL (ref 5–40)

## 2017-05-17 MED ORDER — TECHNETIUM TC 99M TETROFOSMIN IV KIT
32.6000 | PACK | Freq: Once | INTRAVENOUS | Status: AC | PRN
  Administered 2017-05-17: 32.6 via INTRAVENOUS
  Filled 2017-05-17: qty 33

## 2017-05-19 ENCOUNTER — Ambulatory Visit (HOSPITAL_COMMUNITY): Payer: 59 | Attending: Cardiovascular Disease

## 2017-05-19 ENCOUNTER — Encounter (INDEPENDENT_AMBULATORY_CARE_PROVIDER_SITE_OTHER): Payer: Self-pay

## 2017-05-19 LAB — MYOCARDIAL PERFUSION IMAGING
CHL CUP MPHR: 174 {beats}/min
CHL CUP NUCLEAR SRS: 2
CHL CUP NUCLEAR SSS: 5
CHL CUP RESTING HR STRESS: 84 {beats}/min
CSEPEDS: 0 s
CSEPHR: 91 %
Estimated workload: 10.1 METS
Exercise duration (min): 9 min
LHR: 0.32
LV dias vol: 79 mL (ref 46–106)
LV sys vol: 32 mL
NUC STRESS TID: 0.85
Peak HR: 160 {beats}/min
SDS: 3

## 2017-05-19 MED ORDER — TECHNETIUM TC 99M TETROFOSMIN IV KIT
32.1000 | PACK | Freq: Once | INTRAVENOUS | Status: AC | PRN
  Administered 2017-05-19: 32.1 via INTRAVENOUS
  Filled 2017-05-19: qty 33

## 2017-05-20 ENCOUNTER — Telehealth: Payer: Self-pay | Admitting: Physician Assistant

## 2017-05-20 NOTE — Telephone Encounter (Signed)
Let pt know that Stacy Hays was in with a pt at the moment and that she would call her back sometime today in between pts. Pt very appreciative for the call back.

## 2017-05-20 NOTE — Telephone Encounter (Signed)
New Message ° ° pt verbalized that she is returning call for rn °

## 2017-07-21 ENCOUNTER — Encounter: Payer: Self-pay | Admitting: Family Medicine

## 2017-07-21 ENCOUNTER — Ambulatory Visit (INDEPENDENT_AMBULATORY_CARE_PROVIDER_SITE_OTHER): Payer: 59 | Admitting: Family Medicine

## 2017-07-21 ENCOUNTER — Other Ambulatory Visit: Payer: Self-pay | Admitting: Family Medicine

## 2017-07-21 VITALS — BP 124/96 | HR 88 | Temp 98.6°F | Ht 66.0 in | Wt 208.0 lb

## 2017-07-21 DIAGNOSIS — N3000 Acute cystitis without hematuria: Secondary | ICD-10-CM

## 2017-07-21 DIAGNOSIS — E782 Mixed hyperlipidemia: Secondary | ICD-10-CM

## 2017-07-21 LAB — POC URINALSYSI DIPSTICK (AUTOMATED)
BILIRUBIN UA: NEGATIVE
GLUCOSE UA: NEGATIVE
Ketones, UA: NEGATIVE
Nitrite, UA: NEGATIVE
Protein, UA: NEGATIVE
Urobilinogen, UA: 0.2 E.U./dL
pH, UA: 6.5 (ref 5.0–8.0)

## 2017-07-21 MED ORDER — DULOXETINE HCL 30 MG PO CPEP: 30.0000 mg | ORAL_CAPSULE | Freq: Every day | ORAL | 3 refills | Status: DC

## 2017-07-21 MED ORDER — TOLTERODINE TARTRATE ER 4 MG PO CP24: 4.0000 mg | ORAL_CAPSULE | Freq: Every day | ORAL | 3 refills | Status: DC

## 2017-07-21 MED ORDER — ZOLPIDEM TARTRATE 10 MG PO TABS: 10.0000 mg | ORAL_TABLET | Freq: Every day | ORAL | 1 refills | Status: DC

## 2017-07-21 MED ORDER — CIPROFLOXACIN HCL 500 MG PO TABS: 500.0000 mg | ORAL_TABLET | Freq: Two times a day (BID) | ORAL | 0 refills | Status: DC

## 2017-07-21 NOTE — Addendum Note (Signed)
Addended by: Aggie Hacker A on: 07/21/2017 11:33 AM   Modules accepted: Orders

## 2017-07-21 NOTE — Telephone Encounter (Signed)
This was done at her OV today

## 2017-07-21 NOTE — Progress Notes (Signed)
   Subjective:    Patient ID: BRENDOLYN STOCKLEY, female    DOB: Sep 13, 1971, 46 y.o.   MRN: [id number]  HPI Here for 3 days of lower abdominal pressure, urgency to urinate and back pain. No fever. Drinking plenty of water.    Review of Systems  Constitutional: Negative.   Respiratory: Negative.   Cardiovascular: Negative.   Gastrointestinal: Negative.   Genitourinary: Positive for dysuria, frequency and urgency.       Objective:   Physical Exam  Constitutional: She appears well-developed and well-nourished.  Cardiovascular: Normal rate, regular rhythm, normal heart sounds and intact distal pulses.   Pulmonary/Chest: Effort normal and breath sounds normal.  Abdominal: Soft. Bowel sounds are normal. She exhibits no distension and no mass. There is no tenderness. There is no rebound and no guarding.          Assessment & Plan:  UTI, treat with Cipro. Culture the sample.  Alysia Penna, MD

## 2017-07-21 NOTE — Patient Instructions (Signed)
WE NOW OFFER    [facility] FAST TRACK!!!  SAME DAY Appointments for ACUTE CARE  Such as: Sprains, Injuries, cuts, abrasions, rashes, muscle pain, joint pain, back pain Colds, flu, sore throats, headache, allergies, cough, fever  Ear pain, sinus and eye infections Abdominal pain, nausea, vomiting, diarrhea, upset stomach Animal/insect bites  3 Easy Ways to Schedule: Walk-In Scheduling Call in scheduling Mychart Sign-up: [email or web address]         

## 2017-07-21 NOTE — Telephone Encounter (Signed)
Can we refill this? 

## 2017-07-22 LAB — URINE CULTURE

## 2017-08-18 ENCOUNTER — Other Ambulatory Visit (INDEPENDENT_AMBULATORY_CARE_PROVIDER_SITE_OTHER): Payer: 59

## 2017-08-18 DIAGNOSIS — E782 Mixed hyperlipidemia: Secondary | ICD-10-CM

## 2017-08-18 LAB — LIPID PANEL
CHOLESTEROL: 191 mg/dL (ref 0–200)
HDL: 59.6 mg/dL (ref >39.00)
LDL Cholesterol: 119 mg/dL — ABNORMAL HIGH (ref 0–99)
NonHDL: 131.24
TRIGLYCERIDES: 63 mg/dL (ref 0.0–149.0)
Total CHOL/HDL Ratio: 3
VLDL: 12.6 mg/dL (ref 0.0–40.0)

## 2017-08-23 ENCOUNTER — Telehealth: Payer: Self-pay | Admitting: Family Medicine

## 2017-08-23 MED ORDER — ZOLPIDEM TARTRATE 10 MG PO TABS: 10.0000 mg | ORAL_TABLET | Freq: Every day | ORAL | 1 refills | Status: DC

## 2017-08-23 MED ORDER — DULOXETINE HCL 30 MG PO CPEP: 30.0000 mg | ORAL_CAPSULE | Freq: Every day | ORAL | 3 refills | Status: DC

## 2017-08-23 MED ORDER — TRAMADOL HCL 50 MG PO TABS: 50.0000 mg | ORAL_TABLET | Freq: Four times a day (QID) | ORAL | 1 refills | Status: DC

## 2017-08-23 MED ORDER — TOLTERODINE TARTRATE ER 4 MG PO CP24: 4.0000 mg | ORAL_CAPSULE | Freq: Every day | ORAL | 3 refills | Status: DC

## 2017-08-23 NOTE — Telephone Encounter (Signed)
Can we refill these scripts?

## 2017-08-23 NOTE — Telephone Encounter (Signed)
Ready to be faxed.

## 2017-08-23 NOTE — Telephone Encounter (Signed)
° ° °  Pt is now getting rx thru mail order   traMADol (ULTRAM) 50 MG tablet  DULoxetine (CYMBALTA) 30 MG capsule  tolterodine (DETROL LA) 4 MG 24 hr capsule  zolpidem (AMBIEN) 10 MG tablet  Pt is now using the below pharmacy  [facility] mail order

## 2017-08-24 NOTE — Telephone Encounter (Signed)
Scripts were printed and faxed to [facility].  

## 2017-08-25 ENCOUNTER — Telehealth: Payer: Self-pay | Admitting: Family Medicine

## 2017-08-25 NOTE — Telephone Encounter (Signed)
Received fax from [facility], they will no longer process a 90 day supply for Tramadol, will dispense a 30 day supply only. Dr. Sarajane Jews is aware that this did apply for the last script we wrote, will send in new script when requested.

## 2017-08-29 ENCOUNTER — Emergency Department (HOSPITAL_COMMUNITY)
Admission: EM | Admit: 2017-08-29 | Discharge: 2017-08-29 | Disposition: A | Discharge status: Home / Self Care | Payer: 59 | Attending: Emergency Medicine | Admitting: Emergency Medicine

## 2017-08-29 ENCOUNTER — Encounter (HOSPITAL_COMMUNITY): Payer: Self-pay

## 2017-08-29 ENCOUNTER — Emergency Department (HOSPITAL_COMMUNITY): Payer: 59

## 2017-08-29 DIAGNOSIS — M545 Low back pain: Secondary | ICD-10-CM | POA: Diagnosis not present

## 2017-08-29 DIAGNOSIS — F329 Major depressive disorder, single episode, unspecified: Secondary | ICD-10-CM | POA: Insufficient documentation

## 2017-08-29 DIAGNOSIS — Z79899 Other long term (current) drug therapy: Secondary | ICD-10-CM | POA: Diagnosis not present

## 2017-08-29 DIAGNOSIS — M5441 Lumbago with sciatica, right side: Secondary | ICD-10-CM | POA: Diagnosis not present

## 2017-08-29 DIAGNOSIS — F419 Anxiety disorder, unspecified: Secondary | ICD-10-CM | POA: Diagnosis not present

## 2017-08-29 MED ORDER — PREDNISONE 20 MG PO TABS: ORAL_TABLET | ORAL | 0 refills | Status: DC

## 2017-08-29 MED ORDER — DEXAMETHASONE SODIUM PHOSPHATE 10 MG/ML IJ SOLN
10.0000 mg | Freq: Once | INTRAMUSCULAR | Status: AC
  Administered 2017-08-29: 10 mg via INTRAMUSCULAR
  Filled 2017-08-29: qty 1

## 2017-08-29 MED ORDER — CYCLOBENZAPRINE HCL 10 MG PO TABS: 10.0000 mg | ORAL_TABLET | Freq: Two times a day (BID) | ORAL | 0 refills | Status: DC | PRN

## 2017-08-29 MED ORDER — HYDROMORPHONE HCL 1 MG/ML IJ SOLN
1.0000 mg | Freq: Once | INTRAMUSCULAR | Status: AC
  Administered 2017-08-29: 1 mg via INTRAMUSCULAR
  Filled 2017-08-29: qty 1

## 2017-08-29 MED ORDER — HYDROCODONE-ACETAMINOPHEN 5-325 MG PO TABS: 1.0000 | ORAL_TABLET | ORAL | 0 refills | Status: DC | PRN

## 2017-08-29 NOTE — ED Notes (Signed)
Patient brought to room by wheelchair, patient looks to be in discomfort  Back pain noted on exam

## 2017-08-29 NOTE — ED Provider Notes (Signed)
[facility] DEPT Provider Note   CSN: [id number] Arrival date & time: 08/29/17  1624     History   Chief Complaint No chief complaint on file.   HPI Stacy Hays is a 46 y.o. female.  Patient is a 46 year old female who presents with lower back pain. She's had a history of herniated disc in the past and has had prior surgery back in 2012. At that time she had a discectomy of L5-S1. She states over the last week her back is been hurting her worse across the low back. Yesterday she bent over to pick something up and had sudden onset of increased pain in her low back. There some radiation down her right leg. She denies any numbness or weakness in the leg. She has trouble with any movements. Particularly standing up straight increases the pain. There is no loss of bowel or bladder control.she denies any falls or recent trauma to her back. No fevers.      Past Medical History:  Diagnosis Date  . Arthritis   . Depression   . Dysfunctional uterine bleeding   . Headache(784.0)   . Heart murmur   . Herniated disc   . Insomnia   . Overactive bladder   . Plantar fasciitis     Patient Active Problem List   Diagnosis Date Noted  . Anxiety state 05/16/2014  . VIRAL URI 01/19/2011  . GLOSSITIS 01/19/2011  . OBESITY 08/21/2010  . DYSFUNCTIONAL UTERINE BLEEDING 02/04/2010  . UTI 12/10/2009  . CELLULITIS AND ABSCESS OF FACE 02/11/2009  . INSOMNIA 01/31/2009  . Depressive disorder, not elsewhere classified 11/17/2007  . OSTEOARTHRITIS 11/17/2007  . Myalgia and myositis 11/17/2007  . SEIZURES, FEBRILE 11/17/2007  . HEADACHE 11/17/2007  . INCONTINENCE WITHOUT SENSORY AWARENESS 11/17/2007  . CARDIAC MURMUR, HX OF 11/17/2007  . CHICKENPOX, HX OF 11/17/2007    Past Surgical History:  Procedure Laterality Date  . ABDOMINAL PLASTY    . BREAST BIOPSY Right 2013  . ENDOMETRIAL ABLATION    . LEEP    . LUMBAR DISC SURGERY      OB History    No data available       Home  Medications    Prior to Admission medications   Medication Sig Start Date End Date Taking? Authorizing Provider  ALPRAZolam Duanne Moron) 0.25 MG tablet TAKE 1 TABLET BY MOUTH THREE TIMES DAILY AS NEEDED 10/28/16   Laurey Morale, MD  ciprofloxacin (CIPRO) 500 MG tablet Take 1 tablet (500 mg total) by mouth 2 (two) times daily. 07/21/17   Laurey Morale, MD  cyclobenzaprine (FLEXERIL) 10 MG tablet Take 1 tablet (10 mg total) by mouth 2 (two) times daily as needed for muscle spasms. 08/29/17   Malvin Johns, MD  DULoxetine (CYMBALTA) 30 MG capsule Take 1 capsule (30 mg total) by mouth daily. 08/23/17   Laurey Morale, MD  HYDROcodone-acetaminophen (NORCO/VICODIN) 5-325 MG tablet Take 1-2 tablets by mouth every 4 (four) hours as needed. 08/29/17   Malvin Johns, MD  Magnesium 250 MG TABS Take 250 mg by mouth daily.     [provider]  Misc Natural Products (GLUCOS-CHONDROIT-MSM COMPLEX PO) Take 1 tablet by mouth daily.      [provider]  Multiple Vitamin (MULTIVITAMIN) tablet Take 1 tablet by mouth daily.    [provider]  ondansetron (ZOFRAN) 4 MG tablet Take 1 tablet (4 mg total) by mouth every 6 (six) hours. Patient not taking: Reported on 07/21/2017 12/29/16   Carlota Raspberry,  Tiffany, PA-C  predniSONE (DELTASONE) 20 MG tablet 2 tabs po daily x 4 days 08/29/17   Malvin Johns, MD  SUMAtriptan (IMITREX) 100 MG tablet TAKE 1 TABLET BY MOUTH AS NEEDED FOR HEADACHE Patient not taking: Reported on 07/21/2017 09/02/16   Laurey Morale, MD  SUMAtriptan-naproxen (TREXIMET) 85-500 MG per tablet Take 1 tablet by mouth every 2 (two) hours as needed for migraine. 07/14/11 04/28/17  Olegario Messier, NP  tolterodine (DETROL LA) 4 MG 24 hr capsule Take 1 capsule (4 mg total) by mouth daily. 08/23/17   Laurey Morale, MD  traMADol (ULTRAM) 50 MG tablet Take 1 tablet (50 mg total) by mouth every 6 (six) hours. 08/23/17   Laurey Morale, MD  trimethoprim-polymyxin b Mayra Neer) ophthalmic solution Place 2  drops into the left eye 4 (four) times daily as needed. Patient not taking: Reported on 07/21/2017 08/20/15   Laurey Morale, MD  VITAMIN D, CHOLECALCIFEROL, PO Take 1 capsule by mouth daily.      [provider]  zolpidem (AMBIEN) 10 MG tablet Take 1 tablet (10 mg total) by mouth at bedtime. 08/23/17   Laurey Morale, MD    Family History Family History  Problem Relation Age of Onset  . Diabetes Mother 24  . Hyperlipidemia Mother 5  . Hypertension Mother   . Breast cancer Mother 25  . Heart disease Mother   . Cancer Mother   . Kidney disease Unknown   . Prostate cancer Unknown   . Sudden death Unknown   . Coronary artery disease Unknown   . Heart disease Maternal Grandfather   . Birth defects Paternal Grandmother   . Breast cancer Paternal Grandmother   . Heart disease Paternal Grandfather   . Diabetes Paternal Grandfather     Social History Social History  Substance Use Topics  . Smoking status: Never Smoker  . Smokeless tobacco: Never Used  . Alcohol use 0.0 oz/week     Comment: rare     Allergies   Promethazine and Promethazine hcl   Review of Systems Review of Systems  Constitutional: Negative for chills, diaphoresis, fatigue and fever.  HENT: Negative for congestion, rhinorrhea and sneezing.   Eyes: Negative.   Respiratory: Negative for cough, chest tightness and shortness of breath.   Cardiovascular: Negative for chest pain and leg swelling.  Gastrointestinal: Negative for abdominal pain, blood in stool, diarrhea, nausea and vomiting.  Genitourinary: Negative for difficulty urinating, flank pain, frequency and hematuria.  Musculoskeletal: Positive for back pain. Negative for arthralgias.  Skin: Negative for rash.  Neurological: Negative for dizziness, speech difficulty, weakness, numbness and headaches.     Physical Exam Updated Vital Signs BP (!) 142/99   Pulse 90   Temp 98.1 F (36.7 C) (Oral)   Resp 18   SpO2 98%   Physical Exam    Constitutional: She is oriented to person, place, and time. She appears well-developed and well-nourished.  HENT:  Head: Normocephalic and atraumatic.  Eyes: Pupils are equal, round, and reactive to light.  Neck: Normal range of motion. Neck supple.  Cardiovascular: Normal rate, regular rhythm and normal heart sounds.   Pulmonary/Chest: Effort normal and breath sounds normal. No respiratory distress. She has no wheezes. She has no rales. She exhibits no tenderness.  Abdominal: Soft. Bowel sounds are normal. There is no tenderness. There is no rebound and no guarding.  Musculoskeletal: Normal range of motion. She exhibits no edema.  Positive tenderness to the right lumbar paraspinal area.  Positive straight leg raise on the right.  Lymphadenopathy:    She has no cervical adenopathy.  Neurological: She is alert and oriented to person, place, and time.  Motor 5 out of 5 all extremities, sensation grossly intact to light touch all extremities, patellar reflexes symmetric bilaterally, pedal pulses intact  Skin: Skin is warm and dry. No rash noted.  Psychiatric: She has a normal mood and affect.     ED Treatments / Results  Labs (all labs ordered are listed, but only abnormal results are displayed) Labs Reviewed - No data to display  EKG  EKG Interpretation None       Radiology Mr Lumbar Spine Wo Contrast  Result Date: 08/29/2017 CLINICAL DATA:  46 year old female with lumbar back pain. Prior L5-S1 surgery in December 2007. EXAM: MRI LUMBAR SPINE WITHOUT CONTRAST TECHNIQUE: Multiplanar, multisequence MR imaging of the lumbar spine was performed. No intravenous contrast was administered. COMPARISON:  CT Abdomen and Pelvis 12/29/2016. Lumbar MRI 03/23/2007. FINDINGS: Segmentation: Normal as demonstrated on the comparison CT, which is the same numbering system used on the 2008 MRI. Alignment: Normal aside from subtle retrolisthesis of L1 on L2, new since 2008, and mild chronic retrolisthesis  of L5 on S1 which is stable since 2008. Vertebrae: Degenerative appearing mild marrow edema along the anterior superior L2 endplate. No other No marrow edema or evidence of acute osseous abnormality. Chronic degenerative endplate marrow signal changes at L5-S1. Scattered benign vertebral body hemangiomas. Intact visible sacrum and SI joints. Conus medullaris: Extends to the L1 level and appears normal. Paraspinal and other soft tissues: Negative; physiologic appearing small volume free fluid in the visible cul de sac. Disc levels: T11-T12:  Negative. T12-L1:  Negative. L1-L2: Mild retrolisthesis along with disc desiccation and mild disc space loss since 2008. Small mildly lobulated posterior disc bulge (series 6, image 5). No stenosis. L2-L3: Minimal left subarticular disc bulging without stenosis. Chronic mild left facet hypertrophy. L3-L4: Minimal left subarticular disc bulging. Mild facet hypertrophy. No stenosis. L4-L5: Small chronic left subarticular annular fissure of the disc. Mild far lateral disc bulging. Mild facet hypertrophy. Trace facet joint fluid. No stenosis. L5-S1: Chronic disc desiccation and disc space loss. Mild vacuum disc here on the comparison CT. Left greater than right far lateral disc bulging and endplate spurring. Mild architectural distortion at the left lateral recess. No spinal or lateral recess stenosis. Stable mild bilateral L5 foraminal stenosis since 2008. IMPRESSION: 1. Mild retrolisthesis with disc and posterior element degeneration at L1-L2 has developed since 2008. And there is mild degenerative appearing L2 endplate marrow edema there. 2. Chronic L5-S1 degeneration appears stable since 2008. 3. Minimal lumbar disc degeneration otherwise. Mild to moderate facet degeneration at L4-L5 without stenosis. Electronically Signed   By: Genevie Ann M.D.   On: 08/29/2017 19:47    Procedures Procedures (including critical care time)  Medications Ordered in ED Medications  dexamethasone  (DECADRON) injection 10 mg (10 mg Intramuscular Given 08/29/17 1713)  HYDROmorphone (DILAUDID) injection 1 mg (1 mg Intramuscular Given 08/29/17 1713)     Initial Impression / Assessment and Plan / ED Course  I have reviewed the triage vital signs and the nursing notes.  Pertinent labs & imaging results that were available during my care of the patient were reviewed by me and considered in my medical decision making (see chart for details).    MRI does not show evidence of acute significant spinal cord or nerve impingement. Patient is feeling much better after treatment in the  ED. Burnis Medin put her on a short course of steroids and she also got prescriptions for Flexeril and Vicodin. She was referred to follow-up with neurosurgery. Return precautions were given.  Final Clinical Impressions(s) / ED Diagnoses   Final diagnoses:  Acute right-sided low back pain with right-sided sciatica    New Prescriptions New Prescriptions   CYCLOBENZAPRINE (FLEXERIL) 10 MG TABLET    Take 1 tablet (10 mg total) by mouth 2 (two) times daily as needed for muscle spasms.   HYDROCODONE-ACETAMINOPHEN (NORCO/VICODIN) 5-325 MG TABLET    Take 1-2 tablets by mouth every 4 (four) hours as needed.   PREDNISONE (DELTASONE) 20 MG TABLET    2 tabs po daily x 4 days     Malvin Johns, MD 08/29/17 2006

## 2017-08-29 NOTE — ED Notes (Signed)
Called PT twice No Answer in lobby, Placed PT Back In Waiting Room

## 2017-08-29 NOTE — ED Notes (Signed)
ED Provider at bedside. 

## 2017-09-06 ENCOUNTER — Encounter: Payer: Self-pay | Admitting: Family Medicine

## 2017-09-06 ENCOUNTER — Ambulatory Visit (INDEPENDENT_AMBULATORY_CARE_PROVIDER_SITE_OTHER): Payer: 59 | Admitting: Family Medicine

## 2017-09-06 VITALS — BP 124/98 | HR 94 | Temp 98.3°F | Ht 66.0 in | Wt 202.0 lb

## 2017-09-06 DIAGNOSIS — M5440 Lumbago with sciatica, unspecified side: Secondary | ICD-10-CM

## 2017-09-06 MED ORDER — METHYLPREDNISOLONE 4 MG PO TBPK: ORAL_TABLET | ORAL | 0 refills | Status: DC

## 2017-09-06 NOTE — Patient Instructions (Signed)
WE NOW OFFER   [facility] FAST TRACK!!!  SAME DAY Appointments for ACUTE CARE  Such as: Sprains, Injuries, cuts, abrasions, rashes, muscle pain, joint pain, back pain Colds, flu, sore throats, headache, allergies, cough, fever  Ear pain, sinus and eye infections Abdominal pain, nausea, vomiting, diarrhea, upset stomach Animal/insect bites  3 Easy Ways to Schedule: Walk-In Scheduling Call in scheduling Mychart Sign-up: [email or web address]         

## 2017-09-06 NOTE — Progress Notes (Signed)
   Subjective:    Patient ID: Stacy Hays, female    DOB: Apr 08, 1971, 46 y.o.   MRN: [id number]  HPI Here to follow up an ER visit on 08-29-17 for acute low back pain. We reviewed all her recods and test results together today. She had a disc surgery at L5-S1 some years ago and she has done well. However a few days ago she bent over to pick up something and she felt a sudden sharp pain in the lower back. This radiates to the right buttock but not down the legs. No numbness or weakness in the legs. At the ER she had an MRI scan showing a chronic annular fissure along with a small herniation at L4-5. She was given Vicodin, Fkexeril, and steroids, and the pain responded fairly well. She still has some mild pain and stiffness in the lower back. Her concern is that she and her husband plan to go on a [place] cruise leaving this weekend, and she wants to know what to do if the pain flares up again.    Review of Systems  Constitutional: Negative.   Respiratory: Negative.   Cardiovascular: Negative.   Gastrointestinal: Negative.   Genitourinary: Negative.   Musculoskeletal: Positive for back pain.       Objective:   Physical Exam  Constitutional: She appears well-developed and well-nourished. No distress.  Cardiovascular: Normal rate, regular rhythm, normal heart sounds and intact distal pulses.   Pulmonary/Chest: Effort normal and breath sounds normal. No respiratory distress. She has no wheezes. She has no rales.  Musculoskeletal:  She is tender in the lower back with some spasms, ROM is full           Assessment & Plan:  Low back pain. She already has Vicodin and Flexeril. We will give her a Medrol dose pack to take with her and she can use this if the pain flares up. Recheck prn.  Alysia Penna, MD

## 2017-09-08 ENCOUNTER — Ambulatory Visit: Payer: Self-pay | Admitting: Family Medicine

## 2017-11-01 DIAGNOSIS — M5416 Radiculopathy, lumbar region: Secondary | ICD-10-CM | POA: Diagnosis not present

## 2017-11-01 DIAGNOSIS — M5126 Other intervertebral disc displacement, lumbar region: Secondary | ICD-10-CM | POA: Diagnosis not present

## 2017-11-01 DIAGNOSIS — M545 Low back pain: Secondary | ICD-10-CM | POA: Diagnosis not present

## 2017-11-10 ENCOUNTER — Ambulatory Visit: Payer: 59 | Admitting: Family Medicine

## 2017-11-10 ENCOUNTER — Encounter: Payer: Self-pay | Admitting: Family Medicine

## 2017-11-10 ENCOUNTER — Ambulatory Visit: Payer: Self-pay | Admitting: Family Medicine

## 2017-11-10 VITALS — BP 120/80 | HR 85 | Temp 98.2°F | Wt 204.4 lb

## 2017-11-10 DIAGNOSIS — K121 Other forms of stomatitis: Secondary | ICD-10-CM | POA: Diagnosis not present

## 2017-11-10 MED ORDER — MAGIC MOUTHWASH W/LIDOCAINE: 5.0000 mL | Freq: Four times a day (QID) | ORAL | 0 refills | Status: DC | PRN

## 2017-11-10 NOTE — Progress Notes (Signed)
   Subjective:    Patient ID: Stacy Hays, female    DOB: [birth year], 46 y.o.   MRN: [id number]  HPI Here for one month of a burning sensation in the mouth. She has never had this before. No white exudate or anything visible in the mouth. No recent antibiotics , but she did take some oral steroids in September for her back pain. No new medications. She feels fine otherwise.    Review of Systems  Constitutional: Negative.   HENT: Negative for congestion, dental problem, facial swelling, mouth sores, postnasal drip, rhinorrhea, sinus pressure, sinus pain, sneezing, sore throat, trouble swallowing and voice change.   Eyes: Negative.   Respiratory: Negative.        Objective:   Physical Exam  Constitutional: She appears well-developed and well-nourished. No distress.  HENT:  Right Ear: External ear normal.  Left Ear: External ear normal.  Nose: Nose normal.  Mouth/Throat: Oropharynx is clear and moist. No oropharyngeal exudate.  Eyes: Conjunctivae are normal.  Neck: Neck supple. No thyromegaly present.  Pulmonary/Chest: Effort normal and breath sounds normal.  Lymphadenopathy:    She has no cervical adenopathy.          Assessment & Plan:  Mouth soreness of uncertain etiology. Try Magic Mouthwash QID for a few days. Recheck prn. Alysia Penna, MD

## 2017-12-27 ENCOUNTER — Other Ambulatory Visit: Payer: Self-pay | Admitting: Family Medicine

## 2017-12-29 NOTE — Telephone Encounter (Signed)
Last OV 11/10/2017. Rx was last refilled 08/23/2017 disp 90 with 1 refill. Sent to PCP for approval.

## 2017-12-31 NOTE — Telephone Encounter (Signed)
Call in #[phone] with one rf 

## 2018-01-04 ENCOUNTER — Other Ambulatory Visit: Payer: Self-pay | Admitting: Obstetrics and Gynecology

## 2018-01-04 DIAGNOSIS — Z139 Encounter for screening, unspecified: Secondary | ICD-10-CM

## 2018-02-14 ENCOUNTER — Ambulatory Visit: Payer: Self-pay

## 2018-02-17 ENCOUNTER — Ambulatory Visit
Admission: RE | Admit: 2018-02-17 | Discharge: 2018-02-17 | Disposition: A | Discharge status: Home / Self Care | Payer: 59 | Source: Ambulatory Visit | Attending: Obstetrics and Gynecology | Admitting: Obstetrics and Gynecology

## 2018-02-17 DIAGNOSIS — Z1231 Encounter for screening mammogram for malignant neoplasm of breast: Secondary | ICD-10-CM | POA: Diagnosis not present

## 2018-02-17 DIAGNOSIS — Z139 Encounter for screening, unspecified: Secondary | ICD-10-CM

## 2018-02-27 IMAGING — CT CT ABD-PELV W/O CM
2 of 4 series · 17 of 46 positions shown, 19 images · non-contrast
Comparison: None.

CLINICAL DATA: Sudden onset right lower quadrant pain, intermittent
for 1 hour.

EXAM:
CT ABDOMEN AND PELVIS WITHOUT CONTRAST
TECHNIQUE: Multidetector CT imaging of the abdomen and pelvis was performed
following the standard protocol without IV contrast.

[Series 2: a/p w/o 5mm · axial · non-contrast · 0.79mm/px · z∈[-380,+80]mm · 14 of 100 slices shown, 16 images]
[im 4/100  soft-tissue]
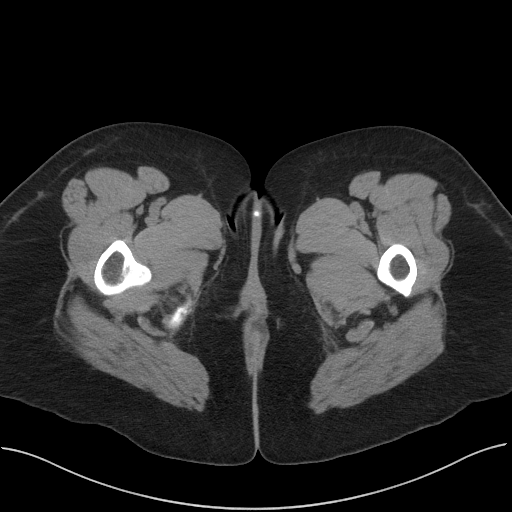
[im 4/100  bone]
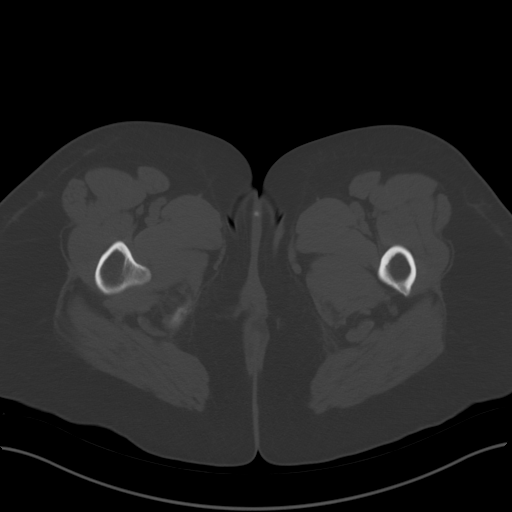
[im 12/100  soft-tissue]
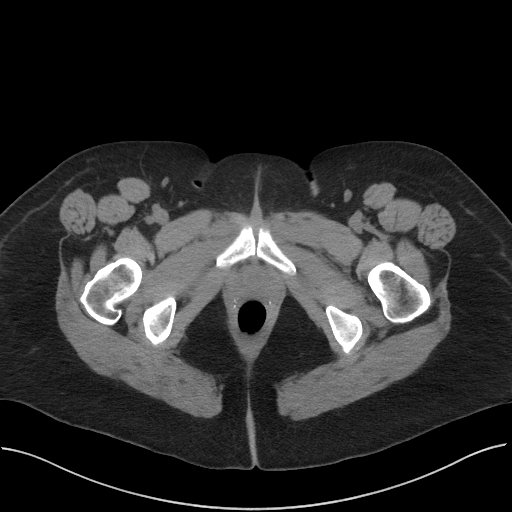
[im 20/100  soft-tissue]
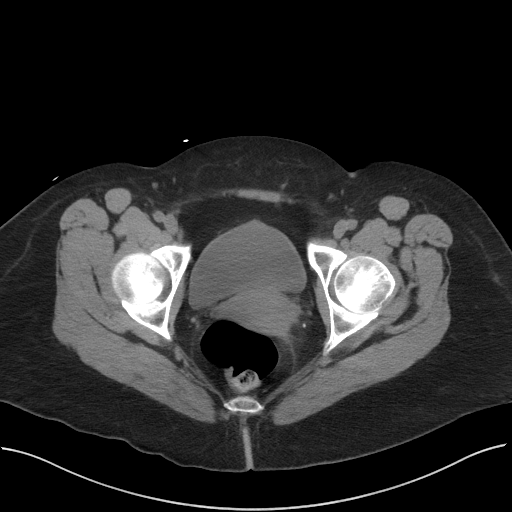
[im 28/100  soft-tissue]
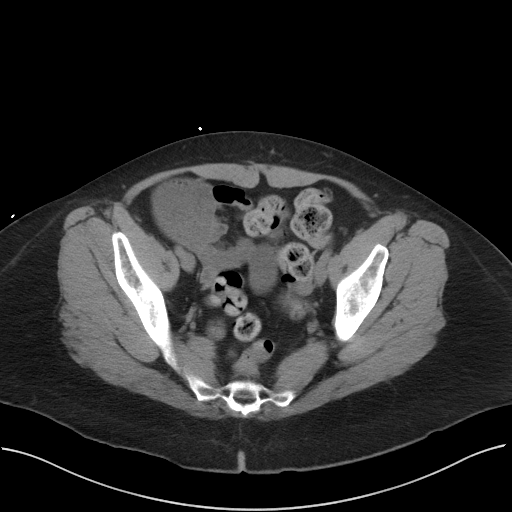
[im 32/100  soft-tissue]
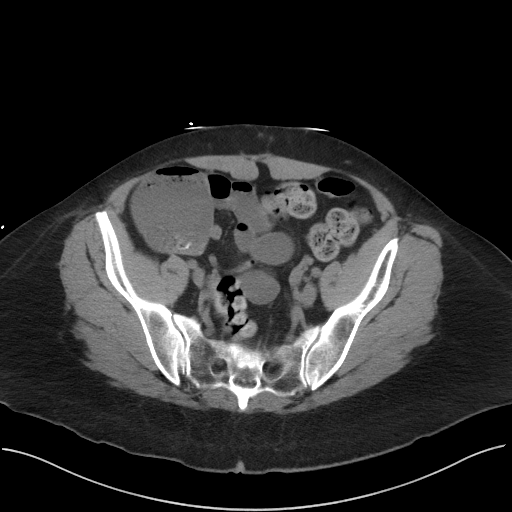
[im 40/100  soft-tissue]
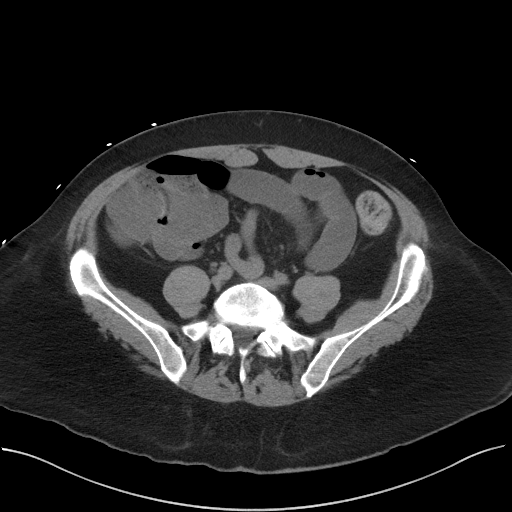
[im 48/100  soft-tissue]
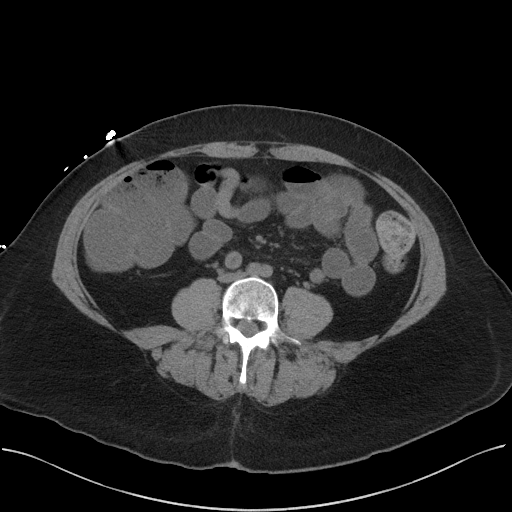
[im 52/100  soft-tissue]
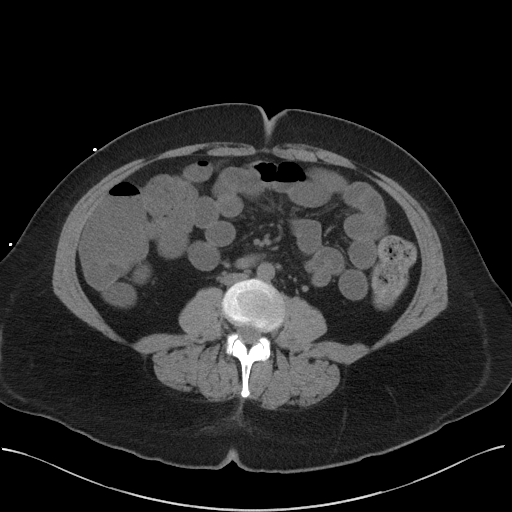
[im 60/100  soft-tissue]
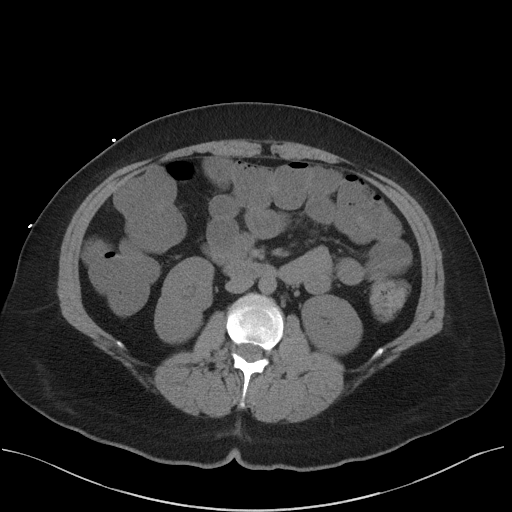
[im 60/100  bone]
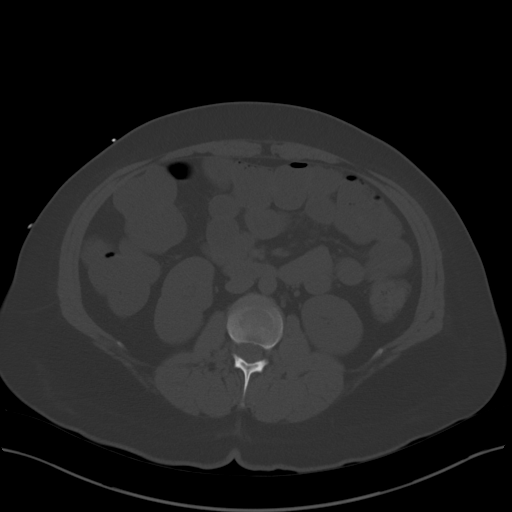
[im 68/100  soft-tissue]
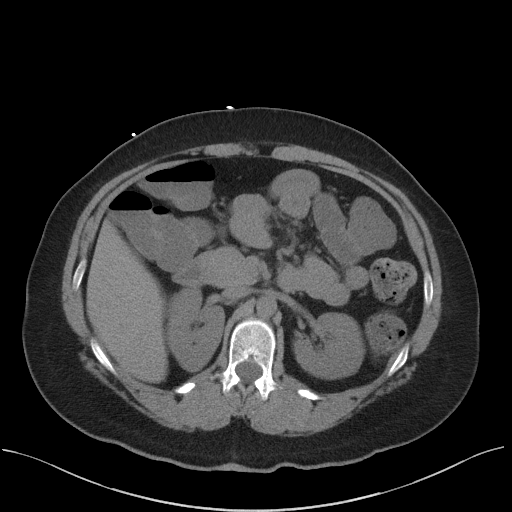
[im 76/100  soft-tissue]
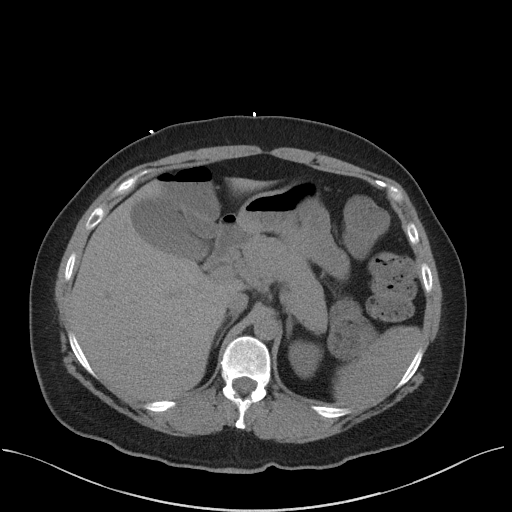
[im 80/100  soft-tissue]
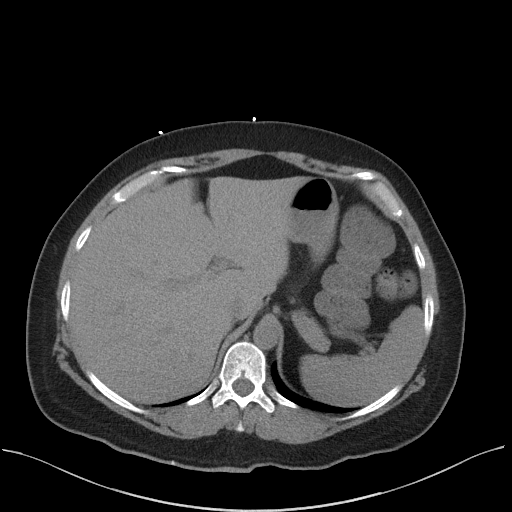
[im 88/100  soft-tissue]
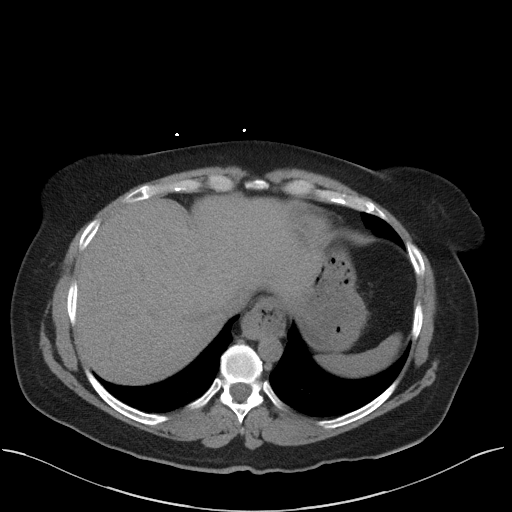
[im 96/100  soft-tissue]
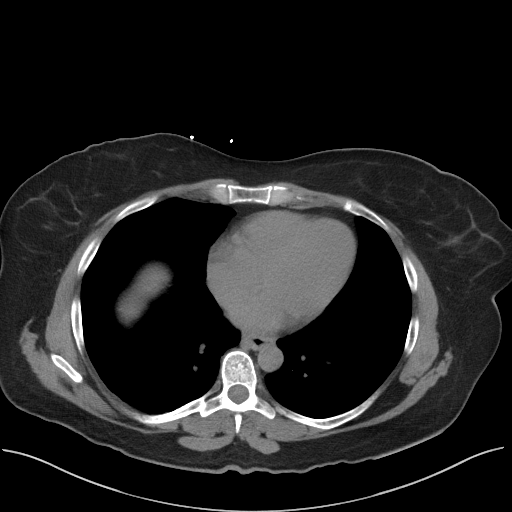

[Series 5: a/p w/o cor · coronal · non-contrast · 0.89mm/px · 3 of 148 slices shown]
[im 50/148  soft-tissue]
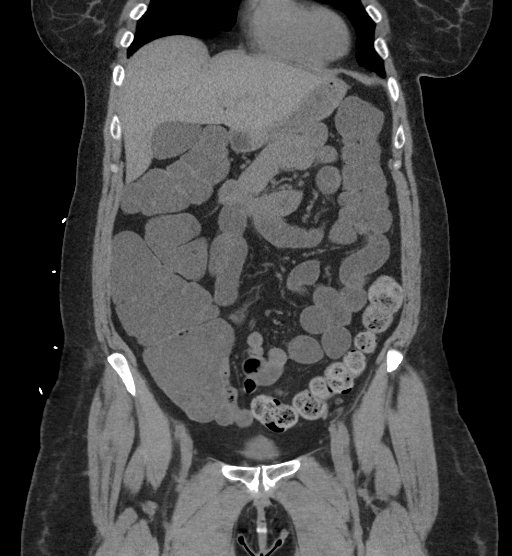
[im 66/148  soft-tissue]
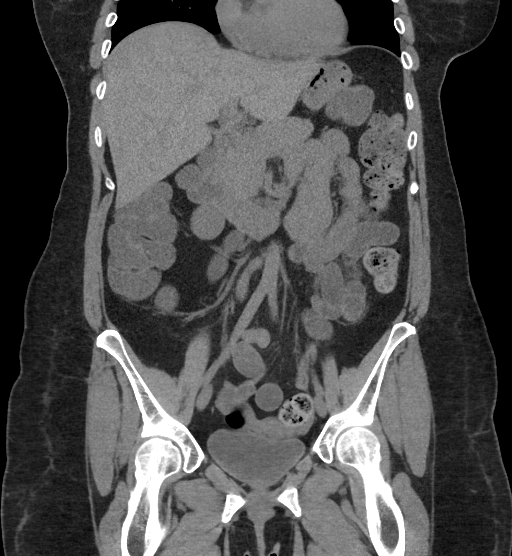
[im 82/148  soft-tissue]
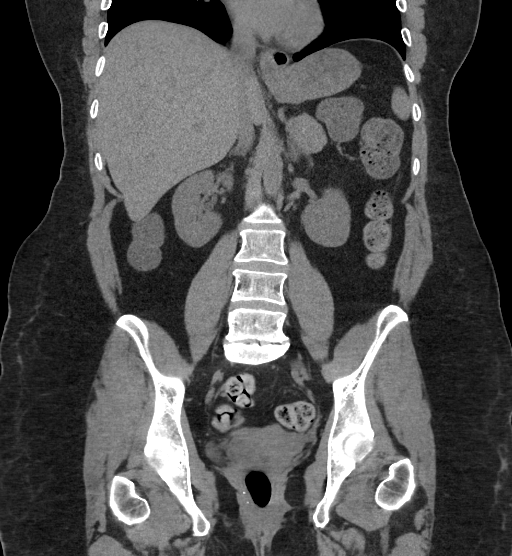

[17 of 46 positions shown; findings below may reference images not displayed]

FINDINGS: Lower chest: No acute abnormality.

Hepatobiliary: No focal liver abnormality is seen. No gallstones,
gallbladder wall thickening, or biliary dilatation.

Pancreas: Unremarkable. No pancreatic ductal dilatation or
surrounding inflammatory changes.

Spleen: Normal in size without focal abnormality.

Adrenals/Urinary Tract: Adrenal glands are unremarkable. Kidneys are
normal, without renal calculi, focal lesion, or hydronephrosis.
Bladder is unremarkable.

Stomach/Bowel: Small hiatal hernia.  Otherwise normal stomach.

The appendix is normal.

The small bowel and colon are distended with fluid. There is no
evidence of bowel obstruction. No extraluminal air. No focal
inflammation involving bowel.

Vascular/Lymphatic: No significant vascular findings are present. No
enlarged abdominal or pelvic lymph nodes.

Reproductive: Uterus and bilateral adnexa are unremarkable.

Other: No ascites.

Musculoskeletal: No significant skeletal lesions.
IMPRESSION: Normal appendix. Moderate fluid distention of small and large bowel
without focal inflammation and without obstruction. Small hiatal
hernia.

## 2018-03-28 ENCOUNTER — Ambulatory Visit: Payer: 59 | Admitting: Family Medicine

## 2018-03-28 ENCOUNTER — Encounter: Payer: Self-pay | Admitting: Family Medicine

## 2018-03-28 VITALS — BP 110/80 | HR 71 | Temp 98.4°F | Ht 66.0 in | Wt 192.6 lb

## 2018-03-28 DIAGNOSIS — K121 Other forms of stomatitis: Secondary | ICD-10-CM | POA: Diagnosis not present

## 2018-03-28 DIAGNOSIS — Z0000 Encounter for general adult medical examination without abnormal findings: Secondary | ICD-10-CM

## 2018-03-28 LAB — CBC WITH DIFFERENTIAL/PLATELET
Basophils Absolute: 0.1 10*3/uL (ref 0.0–0.1)
Basophils Relative: 0.9 % (ref 0.0–3.0)
Eosinophils Absolute: 0.2 10*3/uL (ref 0.0–0.7)
Eosinophils Relative: 3.1 % (ref 0.0–5.0)
HEMATOCRIT: 45.3 % (ref 36.0–46.0)
Hemoglobin: 15.1 g/dL — ABNORMAL HIGH (ref 12.0–15.0)
LYMPHS PCT: 24.3 % (ref 12.0–46.0)
Lymphs Abs: 1.9 10*3/uL (ref 0.7–4.0)
MCHC: 33.4 g/dL (ref 30.0–36.0)
MCV: 89.2 fl (ref 78.0–100.0)
MONOS PCT: 5.7 % (ref 3.0–12.0)
Monocytes Absolute: 0.4 10*3/uL (ref 0.1–1.0)
NEUTROS ABS: 5.2 10*3/uL (ref 1.4–7.7)
Neutrophils Relative %: 66 % (ref 43.0–77.0)
Platelets: 323 10*3/uL (ref 150.0–400.0)
RBC: 5.08 Mil/uL (ref 3.87–5.11)
RDW: 14.1 % (ref 11.5–15.5)
WBC: 7.8 10*3/uL (ref 4.0–10.5)

## 2018-03-28 LAB — POC URINALSYSI DIPSTICK (AUTOMATED)
BILIRUBIN UA: NEGATIVE
Blood, UA: NEGATIVE
Glucose, UA: NEGATIVE
Ketones, UA: NEGATIVE
LEUKOCYTES UA: NEGATIVE
NITRITE UA: NEGATIVE
PH UA: 6.5 (ref 5.0–8.0)
PROTEIN UA: NEGATIVE
Spec Grav, UA: 1.025 (ref 1.010–1.025)
Urobilinogen, UA: 0.2 E.U./dL

## 2018-03-28 LAB — TSH: TSH: 2.35 u[IU]/mL (ref 0.35–4.50)

## 2018-03-28 LAB — HEPATIC FUNCTION PANEL
ALT: 17 U/L (ref 0–35)
AST: 17 U/L (ref 0–37)
Albumin: 4.2 g/dL (ref 3.5–5.2)
Alkaline Phosphatase: 73 U/L (ref 39–117)
BILIRUBIN DIRECT: 0.1 mg/dL (ref 0.0–0.3)
BILIRUBIN TOTAL: 0.7 mg/dL (ref 0.2–1.2)
Total Protein: 6.8 g/dL (ref 6.0–8.3)

## 2018-03-28 LAB — BASIC METABOLIC PANEL
BUN: 13 mg/dL (ref 6–23)
CO2: 30 mEq/L (ref 19–32)
Calcium: 9.5 mg/dL (ref 8.4–10.5)
Chloride: 102 mEq/L (ref 96–112)
Creatinine, Ser: 0.7 mg/dL (ref 0.40–1.20)
GFR: 95.31 mL/min (ref >60.00)
Glucose, Bld: 102 mg/dL — ABNORMAL HIGH (ref 70–99)
Potassium: 4.7 mEq/L (ref 3.5–5.1)
SODIUM: 138 meq/L (ref 135–145)

## 2018-03-28 LAB — LIPID PANEL
CHOL/HDL RATIO: 4
Cholesterol: 171 mg/dL (ref 0–200)
HDL: 48.6 mg/dL (ref >39.00)
LDL CALC: 113 mg/dL — AB (ref 0–99)
NONHDL: 122.24
TRIGLYCERIDES: 46 mg/dL (ref 0.0–149.0)
VLDL: 9.2 mg/dL (ref 0.0–40.0)

## 2018-03-28 LAB — VITAMIN B12: Vitamin B-12: 674 pg/mL (ref 211–911)

## 2018-03-28 NOTE — Progress Notes (Signed)
   Subjective:    Patient ID: Stacy Hays, female    DOB: Jun 14, 1971, 47 y.o.   MRN: [id number]  HPI Here for 3 months of a burning sensation in the mouth and on the tongue. She was seen for this in November and she was treated with Magic Mouthwash. This did not help however. The soreness has continued ever since and to some extent she is losing some taste ability. No visible lesions.    Review of Systems  Constitutional: Negative.   HENT: Negative.   Eyes: Negative.   Respiratory: Negative.   Gastrointestinal: Negative.   Skin: Negative.   Neurological: Negative.        Objective:   Physical Exam  Constitutional: She appears well-developed and well-nourished.  HENT:  Right Ear: External ear normal.  Left Ear: External ear normal.  Nose: Nose normal.  Mouth/Throat: Oropharynx is clear and moist. No oropharyngeal exudate.  Eyes: Conjunctivae are normal.  Neck: Neck supple. No thyromegaly present.  Pulmonary/Chest: Effort normal and breath sounds normal. No respiratory distress. She has no wheezes. She has no rales.  Lymphadenopathy:    She has no cervical adenopathy.          Assessment & Plan:  Stomatitis. The etiology of this has been elusive. We will get labs today including a B12 level. She already takes a multivitamin daily.  Alysia Penna, MD

## 2018-04-08 ENCOUNTER — Telehealth: Payer: Self-pay | Admitting: Family Medicine

## 2018-04-08 DIAGNOSIS — M352 Behcet's disease: Secondary | ICD-10-CM

## 2018-04-08 NOTE — Telephone Encounter (Signed)
Called and spoke with pt. Pt stated that yes she would like the referral placed and she would like to see that same rheumatologist that she was referred to before by [place] in the [place]. Pt could NOT recall the doctor's name. Looks like referral was placed for an all over body rash on by [place] back in 2017

## 2018-04-08 NOTE — Telephone Encounter (Signed)
Sent to PCP ?

## 2018-04-08 NOTE — Telephone Encounter (Signed)
Tell her I think a rheumatologist is better suited to treat this than a dermatologist. I can refer if she wishes

## 2018-04-08 NOTE — Telephone Encounter (Signed)
Copied from [place] [id number]. Topic: General - Other >> Apr 08, 2018  9:22 AM Carolyn Stare wrote:  Pt call to say she is following up on her conversation with Dr Sarajane Jews that he was going to send her to a dermatologist that specializes in [place]   [phone]

## 2018-04-08 NOTE — Telephone Encounter (Signed)
The referral was done  

## 2018-04-08 NOTE — Telephone Encounter (Signed)
Called and spoke with pt. Pt advised and voiced understanding.  

## 2018-04-13 ENCOUNTER — Telehealth: Payer: Self-pay | Admitting: Family Medicine

## 2018-04-13 DIAGNOSIS — Z01419 Encounter for gynecological examination (general) (routine) without abnormal findings: Secondary | ICD-10-CM | POA: Diagnosis not present

## 2018-04-13 DIAGNOSIS — Z124 Encounter for screening for malignant neoplasm of cervix: Secondary | ICD-10-CM | POA: Diagnosis not present

## 2018-04-13 DIAGNOSIS — R768 Other specified abnormal immunological findings in serum: Secondary | ICD-10-CM | POA: Diagnosis not present

## 2018-04-13 DIAGNOSIS — M2550 Pain in unspecified joint: Secondary | ICD-10-CM | POA: Diagnosis not present

## 2018-04-13 DIAGNOSIS — D8989 Other specified disorders involving the immune mechanism, not elsewhere classified: Secondary | ICD-10-CM | POA: Diagnosis not present

## 2018-04-13 DIAGNOSIS — M1990 Unspecified osteoarthritis, unspecified site: Secondary | ICD-10-CM | POA: Diagnosis not present

## 2018-04-13 NOTE — Telephone Encounter (Signed)
Sent to PCP to place referral to ENT

## 2018-04-13 NOTE — Telephone Encounter (Signed)
Copied from [place]. Topic: Referral - Request >> Apr 13, 2018 12:30 PM Antonieta Iba C wrote: Reason for CRM:   Pt says that she was seen by Rheumatology, Dr. Ariel/ she said that she was advised that she need to see a ENT. Pt would like to have a call when referral is complete so that she can know when to be expecting a call from ENT.   CB: [phone]

## 2018-04-14 NOTE — Telephone Encounter (Signed)
I will need to see the rheumatologist's report first

## 2018-04-14 NOTE — Telephone Encounter (Signed)
Called pt and left a detailed VM. Advised pt to call back if needed or if they had further questions.

## 2018-04-22 ENCOUNTER — Encounter: Payer: Self-pay | Admitting: Family Medicine

## 2018-04-22 ENCOUNTER — Ambulatory Visit: Payer: 59 | Admitting: Family Medicine

## 2018-04-22 VITALS — BP 110/80 | HR 83 | Temp 98.2°F | Wt 189.9 lb

## 2018-04-22 DIAGNOSIS — H0289 Other specified disorders of eyelid: Secondary | ICD-10-CM | POA: Diagnosis not present

## 2018-04-22 MED ORDER — POLYMYXIN B-TRIMETHOPRIM 10000-0.1 UNIT/ML-% OP SOLN: 2.0000 [drp] | OPHTHALMIC | 0 refills | Status: DC

## 2018-04-22 NOTE — Progress Notes (Signed)
Subjective:     Patient ID: Stacy Hays, female   DOB: [birth year], 47 y.o.   MRN: [id number]  HPI Patient seen with some mild left lower lid irritation past 4 days. No injury. No foreign body. No contact use. She notes itching initially and now some soreness confined around the mid aspect of the left lower lid. No blurred vision. No matting and no significant drainage. She took some Claritin yesterday without relief.  No periorbital rash.  Past Medical History:  Diagnosis Date  . Arthritis   . Depression   . Dysfunctional uterine bleeding   . Headache(784.0)   . Heart murmur   . Herniated disc   . Insomnia   . Overactive bladder   . Plantar fasciitis    Past Surgical History:  Procedure Laterality Date  . ABDOMINAL PLASTY    . BREAST BIOPSY Right 2013  . ENDOMETRIAL ABLATION    . LEEP    . LUMBAR DISC SURGERY      reports that she has never smoked. She has never used smokeless tobacco. She reports that she drinks alcohol. She reports that she does not use drugs. family history includes Birth defects in her paternal grandmother; Breast cancer in her paternal grandmother; Breast cancer (age of onset: 97) in her mother; Cancer in her mother; Coronary artery disease in her unknown relative; Diabetes in her paternal grandfather; Diabetes (age of onset: 21) in her mother; Heart disease in her maternal grandfather, mother, and paternal grandfather; Hyperlipidemia (age of onset: 55) in her mother; Hypertension in her mother; Kidney disease in her unknown relative; Prostate cancer in her unknown relative; Sudden death in her unknown relative. Allergies  Allergen Reactions  . Promethazine   . Promethazine Hcl     REACTION: Dystonic reaction     Review of Systems  Constitutional: Negative for chills and fever.  Eyes: Positive for pain and itching. Negative for discharge and visual disturbance.       Objective:   Physical Exam  Constitutional: She appears well-developed and  well-nourished.  Eyes: Pupils are equal, round, and reactive to light. EOM are normal. Right eye exhibits no discharge. Left eye exhibits no discharge.  Right conjunctivae normal. External ocular movements are normal. Pupils equal, round and reactive to light. Cornea appears normal. She has some mild erythema of the left lower lid and some localized mild swelling on the mid aspect of lower lid  Cardiovascular: Normal rate and regular rhythm.       Assessment:     Left lower eyelid irritation. Suspect obstructed meibomian gland. No evidence for bacterial conjunctivitis    Plan:     -Continue warm compresses several times daily -Polytrim ophthalmic drops 2 drops left eye every 4 awake -Follow-up immediately for any progressive swelling redness or other concerns. Touch base by end of next week if not resolving with compresses  Eulas Post MD [facility]

## 2018-04-22 NOTE — Patient Instructions (Signed)
Continue with warm compresses several times daily  Let me now by end of next week if no better.

## 2018-04-27 ENCOUNTER — Ambulatory Visit: Payer: 59 | Admitting: Family Medicine

## 2018-04-27 ENCOUNTER — Encounter: Payer: Self-pay | Admitting: Family Medicine

## 2018-04-27 VITALS — BP 116/80 | HR 71 | Temp 98.5°F | Wt 186.0 lb

## 2018-04-27 DIAGNOSIS — H00015 Hordeolum externum left lower eyelid: Secondary | ICD-10-CM

## 2018-04-27 DIAGNOSIS — K121 Other forms of stomatitis: Secondary | ICD-10-CM

## 2018-04-27 MED ORDER — DULOXETINE HCL 30 MG PO CPEP: 30.0000 mg | ORAL_CAPSULE | Freq: Every day | ORAL | 3 refills | Status: DC

## 2018-04-27 MED ORDER — TOLTERODINE TARTRATE ER 4 MG PO CP24: 4.0000 mg | ORAL_CAPSULE | Freq: Every day | ORAL | 3 refills | Status: DC

## 2018-04-27 MED ORDER — TOBRAMYCIN-DEXAMETHASONE 0.3-0.1 % OP SUSP: 2.0000 [drp] | OPHTHALMIC | 0 refills | Status: DC

## 2018-04-27 MED ORDER — TRAMADOL HCL 50 MG PO TABS: 50.0000 mg | ORAL_TABLET | Freq: Four times a day (QID) | ORAL | 1 refills | Status: DC

## 2018-04-27 NOTE — Progress Notes (Signed)
   Subjective:    Patient ID: Stacy Hays, female    DOB: [birth year], 47 y.o.   MRN: [id number]  HPI Here to recheck her left eye. About a week ago she developed swelling and mild pain in the left lower eyelid. She was seen here and was given Polytrim drops. She has also been applying warm compresses. Today the swelling is down some but there is a small knot on the inside of the lid. Her vision is fine.    Review of Systems  Constitutional: Negative.   HENT: Negative.   Eyes: Negative for photophobia, pain, discharge, redness, itching and visual disturbance.  Respiratory: Negative.   Cardiovascular: Negative.        Objective:   Physical Exam  Constitutional: She appears well-developed and well-nourished.  HENT:  Right Ear: External ear normal.  Left Ear: External ear normal.  Nose: Nose normal.  Mouth/Throat: Oropharynx is clear and moist.  Eyes: Pupils are equal, round, and reactive to light. Conjunctivae and EOM are normal.  There is a small tender stye on the inside of the left lower eyelid  Neck: Neck supple. No thyromegaly present.  Pulmonary/Chest: Effort normal and breath sounds normal.  Lymphadenopathy:    She has no cervical adenopathy.          Assessment & Plan:  Stye. Switch to Tobradex drops every 4 hours. Recheck prn.  Alysia Penna, MD

## 2018-05-11 DIAGNOSIS — K146 Glossodynia: Secondary | ICD-10-CM | POA: Diagnosis not present

## 2018-05-11 DIAGNOSIS — K121 Other forms of stomatitis: Secondary | ICD-10-CM | POA: Diagnosis not present

## 2018-06-27 ENCOUNTER — Other Ambulatory Visit: Payer: Self-pay | Admitting: Family Medicine

## 2018-06-27 NOTE — Telephone Encounter (Signed)
Last refill 12/31/17 and last office visit 04/27/18.  Okay to fill?

## 2018-06-29 NOTE — Telephone Encounter (Signed)
Call in #[phone] with one rf 

## 2018-06-29 NOTE — Telephone Encounter (Signed)
Rx phoned to [facility] and received by Grayland Ormond, the pharmacist.

## 2018-08-17 ENCOUNTER — Ambulatory Visit: Payer: Self-pay | Admitting: Family Medicine

## 2018-11-22 DIAGNOSIS — S83241A Other tear of medial meniscus, current injury, right knee, initial encounter: Secondary | ICD-10-CM | POA: Diagnosis not present

## 2018-12-22 ENCOUNTER — Other Ambulatory Visit: Payer: Self-pay | Admitting: Family Medicine

## 2018-12-23 ENCOUNTER — Other Ambulatory Visit: Payer: Self-pay | Admitting: Family Medicine

## 2018-12-23 NOTE — Telephone Encounter (Signed)
Dr. Fry please advise on refill. Thanks  

## 2018-12-23 NOTE — Telephone Encounter (Signed)
Call in #[phone] with one rf 

## 2018-12-26 ENCOUNTER — Encounter: Payer: Self-pay | Admitting: Family Medicine

## 2018-12-26 NOTE — Telephone Encounter (Signed)
Call in #[phone] with one rf

## 2018-12-26 NOTE — Telephone Encounter (Signed)
Dr. Sarajane Jews please advise on refill of tramadol.  thanks

## 2018-12-26 NOTE — Telephone Encounter (Signed)
rx printed and signed and faxed to mail order pharmacy

## 2018-12-27 NOTE — Telephone Encounter (Signed)
rx has been printed out and faxed to the mail order pharmacy 

## 2019-01-25 ENCOUNTER — Other Ambulatory Visit: Payer: Self-pay | Admitting: Obstetrics and Gynecology

## 2019-01-25 DIAGNOSIS — Z1231 Encounter for screening mammogram for malignant neoplasm of breast: Secondary | ICD-10-CM

## 2019-02-22 ENCOUNTER — Ambulatory Visit
Admission: RE | Admit: 2019-02-22 | Discharge: 2019-02-22 | Disposition: A | Discharge status: Home / Self Care | Payer: 59 | Source: Ambulatory Visit | Attending: Obstetrics and Gynecology | Admitting: Obstetrics and Gynecology

## 2019-02-22 DIAGNOSIS — Z1231 Encounter for screening mammogram for malignant neoplasm of breast: Secondary | ICD-10-CM

## 2019-02-24 ENCOUNTER — Other Ambulatory Visit: Payer: Self-pay | Admitting: Obstetrics and Gynecology

## 2019-02-24 DIAGNOSIS — R928 Other abnormal and inconclusive findings on diagnostic imaging of breast: Secondary | ICD-10-CM

## 2019-03-01 ENCOUNTER — Ambulatory Visit
Admission: RE | Admit: 2019-03-01 | Discharge: 2019-03-01 | Disposition: A | Discharge status: Home / Self Care | Payer: 59 | Source: Ambulatory Visit | Attending: Obstetrics and Gynecology | Admitting: Obstetrics and Gynecology

## 2019-03-01 ENCOUNTER — Other Ambulatory Visit: Payer: Self-pay | Admitting: Obstetrics and Gynecology

## 2019-03-01 DIAGNOSIS — R921 Mammographic calcification found on diagnostic imaging of breast: Secondary | ICD-10-CM

## 2019-03-01 DIAGNOSIS — R928 Other abnormal and inconclusive findings on diagnostic imaging of breast: Secondary | ICD-10-CM

## 2019-03-02 ENCOUNTER — Other Ambulatory Visit: Payer: Self-pay | Admitting: Obstetrics and Gynecology

## 2019-03-02 DIAGNOSIS — R921 Mammographic calcification found on diagnostic imaging of breast: Secondary | ICD-10-CM

## 2019-03-08 ENCOUNTER — Ambulatory Visit
Admission: RE | Admit: 2019-03-08 | Discharge: 2019-03-08 | Disposition: A | Discharge status: Home / Self Care | Payer: 59 | Source: Ambulatory Visit | Attending: Obstetrics and Gynecology | Admitting: Obstetrics and Gynecology

## 2019-03-08 ENCOUNTER — Other Ambulatory Visit: Payer: Self-pay

## 2019-03-08 DIAGNOSIS — R921 Mammographic calcification found on diagnostic imaging of breast: Secondary | ICD-10-CM | POA: Diagnosis not present

## 2019-03-08 DIAGNOSIS — N641 Fat necrosis of breast: Secondary | ICD-10-CM | POA: Diagnosis not present

## 2019-03-09 ENCOUNTER — Other Ambulatory Visit: Payer: Self-pay | Admitting: Family Medicine

## 2019-03-15 ENCOUNTER — Telehealth: Payer: Self-pay | Admitting: Genetic Counselor

## 2019-03-15 NOTE — Telephone Encounter (Signed)
LM about her appointment on 3/30.  ASked that she CB.

## 2019-03-16 NOTE — Telephone Encounter (Signed)
Patient called and we r/s her appointment to June 8.

## 2019-03-27 ENCOUNTER — Encounter: Payer: 59 | Admitting: Genetic Counselor

## 2019-03-27 ENCOUNTER — Other Ambulatory Visit: Payer: 59

## 2019-05-23 ENCOUNTER — Other Ambulatory Visit: Payer: Self-pay | Admitting: Family Medicine

## 2019-06-01 ENCOUNTER — Other Ambulatory Visit: Payer: Self-pay | Admitting: Genetic Counselor

## 2019-06-01 ENCOUNTER — Telehealth: Payer: Self-pay | Admitting: Genetic Counselor

## 2019-06-01 DIAGNOSIS — Z803 Family history of malignant neoplasm of breast: Secondary | ICD-10-CM

## 2019-06-01 NOTE — Telephone Encounter (Signed)
Called patient regarding upcoming Webex appointment, patient would prefer this to be a walk-in visit. °

## 2019-06-02 ENCOUNTER — Other Ambulatory Visit: Payer: Self-pay | Admitting: Family Medicine

## 2019-06-05 ENCOUNTER — Encounter: Payer: Self-pay | Admitting: Genetic Counselor

## 2019-06-05 ENCOUNTER — Inpatient Hospital Stay: Payer: 59 | Attending: Genetic Counselor | Admitting: Genetic Counselor

## 2019-06-05 ENCOUNTER — Inpatient Hospital Stay: Payer: 59

## 2019-06-05 DIAGNOSIS — Z803 Family history of malignant neoplasm of breast: Secondary | ICD-10-CM | POA: Diagnosis not present

## 2019-06-05 DIAGNOSIS — Z7183 Encounter for nonprocreative genetic counseling: Secondary | ICD-10-CM

## 2019-06-05 NOTE — Progress Notes (Addendum)
REFERRING PROVIDER: Laurey Morale, MD [place], [place] [address]  PRIMARY PROVIDER:  Laurey Morale, MD  PRIMARY REASON FOR VISIT:  1. Family history of breast cancer      HISTORY OF PRESENT ILLNESS:   I connected with Ms. Martine on 06/05/2019 at 9 AM EDT by [facility] video conference and verified that I am speaking with the correct person using two identifiers.   Patient location: Home Provider location: Office  Ms. Farnan, a 48 y.o. female, was seen for a [place] cancer genetics consultation at the request of Dr. Sarajane Jews due to a family history of cancer.  Ms. Reining presents to clinic today to discuss the possibility of a hereditary predisposition to cancer, genetic testing, and to further clarify her future cancer risks, as well as potential cancer risks for family members.   Ms. Madonia is a 48 y.o. female with no personal history of cancer.  Her mother had breast cancer at age 102, as did several maternal family members.  CANCER HISTORY:   No history exists.     RISK FACTORS:  Menarche was at age 47-13.  First live birth at age 23.  OCP use for approximately several years.  Ovaries intact: yes.  Hysterectomy: no.  Menopausal status: perimenopausal.  HRT use: 0 years. Colonoscopy: no; not examined. Mammogram within the last year: yes. Number of breast biopsies: 2. Up to date with pelvic exams: yes. Any excessive radiation exposure in the past: no  Past Medical History:  Diagnosis Date  . Arthritis   . Depression   . Dysfunctional uterine bleeding   . Family history of breast cancer   . Headache(784.0)   . Heart murmur   . Herniated disc   . Insomnia   . Overactive bladder   . Plantar fasciitis     Past Surgical History:  Procedure Laterality Date  . ABDOMINAL PLASTY    . BREAST BIOPSY Right 2013  . ENDOMETRIAL ABLATION    . LEEP    . LUMBAR DISC SURGERY      Social History   Socioeconomic History  . Marital status: Married   Spouse name: Not on file  . Number of children: Not on file  . Years of education: Not on file  . Highest education level: Not on file  Occupational History  . Not on file  Social Needs  . Financial resource strain: Not on file  . Food insecurity:    Worry: Not on file    Inability: Not on file  . Transportation needs:    Medical: Not on file    Non-medical: Not on file  Tobacco Use  . Smoking status: Never Smoker  . Smokeless tobacco: Never Used  Substance and Sexual Activity  . Alcohol use: Yes    Alcohol/week: 0.0 standard drinks    Comment: rare  . Drug use: No  . Sexual activity: Yes    Partners: Male  Lifestyle  . Physical activity:    Days per week: Not on file    Minutes per session: Not on file  . Stress: Not on file  Relationships  . Social connections:    Talks on phone: Not on file    Gets together: Not on file    Attends religious service: Not on file    Active member of club or organization: Not on file    Attends meetings of clubs or organizations: Not on file    Relationship status: Not on file  Other  Topics Concern  . Not on file  Social History Narrative  . Not on file     FAMILY HISTORY:  We obtained a detailed, 4-generation family history.  Significant diagnoses are listed below: Family History  Problem Relation Age of Onset  . Diabetes Mother 33  . Hyperlipidemia Mother 84  . Hypertension Mother   . Breast cancer Mother 74  . Heart disease Mother   . Kidney disease Other   . Prostate cancer Other   . Sudden death Other   . Coronary artery disease Other   . Heart disease Maternal Grandfather 42  . Birth defects Paternal Grandmother   . Breast cancer Paternal Grandmother   . Heart disease Paternal Grandfather   . Diabetes Paternal Grandfather   . Breast cancer Maternal Grandmother 69  . Cervical cancer Maternal Aunt   . Mental retardation Maternal Aunt   . Cerebral palsy Maternal Aunt   . Breast cancer Cousin        dx in her 83s,  mat first cousin  . Breast cancer Cousin        mat first cousin    The patient has a son and two daughters who are cancer free.  She has one brother and two paternal half sisters who are call cancer free.  Both parents are living.  The patient 's mother was diagnosed with breast cancer at age 2.  She has two sisters, one who had cervical cancer.  Her parents are deceased.  The mother had breast cancer.  She had several brothers, two who had daughters with breast cancer.  The patient's father is living, but she is not in contact with him.  He has two sisters who are cancer free.  His parents are deceased.  His mother had breast cancer in her 58's.  Ms. Schaaf is unaware of previous family history of genetic testing for hereditary cancer risks. Patient's maternal ancestors are of Caucasian descent, and paternal ancestors are of Caucasian descent. There is no reported Ashkenazi Jewish ancestry. There is no known consanguinity.  GENETIC COUNSELING ASSESSMENT: Ms. Donovan is a 48 y.o. female with a family history of breast cancer which is somewhat suggestive of a hereditary cancer syndrome and predisposition to cancer. We, therefore, discussed and recommended the following at today's visit.   DISCUSSION: We discussed that 5 - 10% of breast cancer is hereditary, with most cases associated with BRCA mutations.  There are other genes that can be associated with hereditary breast cancer syndromes.  These include ATM, CHEK2 and PALB2.  Depending on the gene someone tests positive for, will determine how their medical management changes in the future.      We discussed that some people do not want to undergo genetic testing due to fear of genetic discrimination.  A federal law called the Genetic Information Non-Discrimination Act (GINA) of 2008 helps protect individuals against genetic discrimination based on their genetic test results.  It impacts both health insurance and employment.  With health  insurance, it protects against increased premiums, being kicked off insurance or being forced to take a test in order to be insured.  For employment it protects against hiring, firing and promoting decisions based on genetic test results.  Health status due to a cancer diagnosis is not protected under GINA.    We reviewed the characteristics, features and inheritance patterns of hereditary cancer syndromes. We also discussed genetic testing, including the appropriate family members to test, the process of testing, insurance coverage and  turn-around-time for results. We discussed the implications of a negative, positive and/or variant of uncertain significant result. We recommended Ms. Bova pursue genetic testing for the common hereditary cancer gene panel. The Common Hereditary Gene Panel offered by [facility] includes sequencing and/or deletion duplication testing of the following 48 genes: APC, ATM, AXIN2, BARD1, BMPR1A, BRCA1, BRCA2, BRIP1, CDH1, CDK4, CDKN2A (p14ARF), CDKN2A (p16INK4a), CHEK2, CTNNA1, DICER1, EPCAM (Deletion/duplication testing only), GREM1 (promoter region deletion/duplication testing only), KIT, MEN1, MLH1, MSH2, MSH3, MSH6, MUTYH, NBN, NF1, NHTL1, PALB2, PDGFRA, PMS2, POLD1, POLE, PTEN, RAD50, RAD51C, RAD51D, RNF43, SDHB, SDHC, SDHD, SMAD4, SMARCA4. STK11, TP53, TSC1, TSC2, and VHL.  The following genes were evaluated for sequence changes only: SDHA and HOXB13 c.251G>A variant only.   Based on Ms. Fadely's family history of cancer, she meets medical criteria for genetic testing. Despite that she meets criteria, she may still have an out of pocket cost. We discussed that if her out of pocket cost for testing is over $100, the laboratory will call and confirm whether she wants to proceed with testing.  If the out of pocket cost of testing is less than $100 she will be billed by the genetic testing laboratory. Despite that she meets criteria for testing, she is not the most informative  person in the family to test.  We recommend that her mother undergo genetic testing as she has had a diagnosis of breast cancer.  Based on the patient's family history, a statistical model (Tyrer Cusik) was used to estimate her risk of developing breast cancer. This estimates her lifetime risk of developing breast cancer to be approximately 19.8%. This estimation does not consider any genetic testing results.  The patient's lifetime breast cancer risk is a preliminary estimate based on available information using one of several models endorsed by the [place] (ACS). The ACS recommends consideration of breast MRI screening as an adjunct to mammography for patients at high risk (defined as 20% or greater lifetime risk).   Ms. Ruotolo has been determined to be at high risk for breast cancer.  Therefore, we recommend that annual screening with mammography and breast MRI be performed.  We discussed that Ms. Carcamo should discuss her individual situation with her referring physician and determine a breast cancer screening plan with which they are both comfortable.  We will refer her to the high risk breast clinic here at [facility].  PLAN: Ms. Pfeifer did not wish to pursue genetic testing at today's visit. Instead, we will do a benefits investigation of her mother's insurance, as her mother is the most informative to test.  If her mother's insurance will not cover testing, we will draw blood on Ms. Mires and perform testing on her.  We understand this decision and remain available to coordinate genetic testing at any time in the future. We, therefore, recommend Ms. Marandola continue to follow the cancer screening guidelines given by her primary healthcare provider.  Lastly, we encouraged Ms. Six to remain in contact with cancer genetics annually so that we can continuously update the family history and inform her of any changes in cancer genetics and testing that may be of benefit for this family.    Ms. Lomeli questions were answered to her satisfaction today. Our contact information was provided should additional questions or concerns arise. Thank you for the referral and allowing [place] to share in the care of your patient.   Karen P. Florene Glen, [place], [facility] Certified Genetic Counselor [email or web address] phone: [phone]  The patient was seen for  a total of 60 minutes in video conferencing genetic counseling.  This patient was discussed with Drs. Magrinat, Lindi Adie and/or Burr Medico who agrees with the above.    _______________________________________________________________________ For Office Staff:  Number of people involved in session: 2 Was an Intern/ student involved with case: no

## 2019-06-12 ENCOUNTER — Telehealth: Payer: Self-pay | Admitting: Genetic Counselor

## 2019-06-12 NOTE — Telephone Encounter (Signed)
On 6/11 I attempted to call the pt on her cell and home number. I wasn't able to leave a vm on her cell, but did leave one on the home number.

## 2019-06-12 NOTE — Telephone Encounter (Signed)
-----   Message from Chauncey Cruel, MD sent at 06/12/2019  7:40 AM EDT ----- Seth Bake, I do not find this pt scheduled for high risk clinic yet-- could you update [place]?  Thank you!  GM ----- Message ----- From: Clarene Essex, Counselor Sent: 06/05/2019  12:06 PM EDT To: Laurey Morale, MD, Chauncey Cruel, MD, #  Patient is considered high risk for breast cancer.  We will refer to high risk clinic.  Seth Bake, can you please call the patient and schedule her in the High Risk clinic?  Thank you.

## 2019-06-23 ENCOUNTER — Encounter: Payer: Self-pay | Admitting: Obstetrics & Gynecology

## 2019-06-23 ENCOUNTER — Other Ambulatory Visit: Payer: Self-pay

## 2019-06-23 ENCOUNTER — Ambulatory Visit: Payer: 59 | Admitting: Obstetrics & Gynecology

## 2019-06-23 VITALS — BP 120/80 | HR 96 | Temp 97.2°F | Resp 16 | Ht 66.5 in | Wt 210.2 lb

## 2019-06-23 DIAGNOSIS — Z9189 Other specified personal risk factors, not elsewhere classified: Secondary | ICD-10-CM | POA: Diagnosis not present

## 2019-06-23 DIAGNOSIS — N938 Other specified abnormal uterine and vaginal bleeding: Secondary | ICD-10-CM

## 2019-06-23 DIAGNOSIS — Z803 Family history of malignant neoplasm of breast: Secondary | ICD-10-CM

## 2019-06-23 NOTE — Progress Notes (Signed)
48 y.o. G34P3000 Married White or Caucasian female here for new patient exam.  Has been experiencing irregular bleeding that has been present for >10 years ago.  She had an IUD placed with Dr. Ronita Hipps after being on some different OCPs.  Has IUD removal and endometrial ablation at that time same.  Had negative endometrial (that I can see in Epic) in 2009.    Transferred care to Dr. Harrington Challenger.  She reports she had an ultrasound that was normal.  She does have yearly pap smear and HR HPV testing.  She's not had a repeat endometrial biopsy.    She does have irregular bleeding after bowel movements, intercourse, and just at random times.    Saw Roma Kayser 06/05/2019 for strong family hx of breast cancer.  Lifetime risk was 19.8%.  Reports karen is checking into whether breast MRI can be covered for her.  Limited breast MRI was discussed as well.  No LMP recorded. Patient has had an ablation.          Sexually active: Yes.    The current method of family planning is vasectomy.    Exercising: No Smoker:  no  Health Maintenance: Pap:  04/2019 Dr. Harrington Challenger   04/07/17 neg. HR HPV:neg  History of abnormal Pap:  Yes, LEEP MMG:  02/2019, biopsy negative Colonoscopy:  Never BMD:   Never TDaP: Current    reports that she has never smoked. She has never used smokeless tobacco. She reports current alcohol use of about 3.0 standard drinks of alcohol per week. She reports that she does not use drugs.  Past Medical History:  Diagnosis Date  . Arthritis   . Depression   . Dysfunctional uterine bleeding   . Family history of breast cancer   . Headache(784.0)   . Heart murmur   . Herniated disc   . Insomnia   . Overactive bladder   . Plantar fasciitis     Past Surgical History:  Procedure Laterality Date  . ABDOMINAL PLASTY    . BREAST BIOPSY Right 2013  . ENDOMETRIAL ABLATION    . LEEP  1993   had 2 in the same year   . LUMBAR DISC SURGERY      Current Outpatient Medications  Medication Sig Dispense  Refill  . DULoxetine (CYMBALTA) 30 MG capsule TAKE 1 CAPSULE BY MOUTH  DAILY 90 capsule 0  . Magnesium 250 MG TABS Take 250 mg by mouth daily.     . Misc Natural Products (GLUCOS-CHONDROIT-MSM COMPLEX PO) Take 1 tablet by mouth daily.      . Multiple Vitamin (MULTIVITAMIN) tablet Take 1 tablet by mouth daily.    . Red Yeast Rice 600 MG TABS Take once by mouth.    . SUMAtriptan (IMITREX) 100 MG tablet TAKE 1 TABLET BY MOUTH AS NEEDED FOR HEADACHE 10 tablet 11  . tobramycin-dexamethasone (TOBRADEX) ophthalmic solution Place 2 drops into the left eye every 4 (four) hours while awake. 10 mL 0  . tolterodine (DETROL LA) 4 MG 24 hr capsule TAKE 1 CAPSULE BY MOUTH  DAILY 90 capsule 0  . traMADol (ULTRAM) 50 MG tablet TAKE 1 TABLET BY MOUTH  EVERY 6 HOURS 360 tablet 1  . VITAMIN D, CHOLECALCIFEROL, PO Take 1 capsule by mouth daily.      Marland Kitchen zolpidem (AMBIEN) 10 MG tablet TAKE 1 TABLET BY MOUTH AT  BEDTIME 90 tablet 1   No current facility-administered medications for this visit.     Family History  Problem  Relation Age of Onset  . Diabetes Mother 18  . Hyperlipidemia Mother 36  . Hypertension Mother   . Breast cancer Mother 3  . Heart disease Mother   . Kidney disease Other   . Prostate cancer Other   . Sudden death Other   . Coronary artery disease Other   . Heart disease Maternal Grandfather 42  . Birth defects Paternal Grandmother   . Breast cancer Paternal Grandmother   . Heart disease Paternal Grandfather   . Diabetes Paternal Grandfather   . Stroke Paternal Grandfather   . Breast cancer Maternal Grandmother 55  . Cervical cancer Maternal Aunt   . Mental retardation Maternal Aunt   . Cerebral palsy Maternal Aunt   . Breast cancer Cousin        dx in her 62s, mat first cousin  . Breast cancer Cousin        mat first cousin    Review of Systems  Genitourinary:       Cramping   All other systems reviewed and are negative.   Exam:   BP 120/80 (BP Location: Left Arm, Patient  Position: Sitting, Cuff Size: Normal)   Pulse 96   Temp (!) 97.2 F (36.2 C) (Oral)   Resp 16   Ht 5' 6.5" (1.689 m)   Wt 210 lb 4 oz (95.4 kg)   BMI 33.43 kg/m   Height: 5' 6.5" (168.9 cm)  Ht Readings from Last 3 Encounters:  06/23/19 5' 6.5" (1.689 m)  03/28/18 5\' 6"  (1.676 m)  09/06/17 5\' 6"  (1.676 m)   Physical Exam  Constitutional: She is oriented to person, place, and time. She appears well-developed and well-nourished.  Neurological: She is alert and oriented to person, place, and time.  Psychiatric: She has a normal mood and affect.  NO other exam performed today  Chaperone was present for exam.  A:  Prolonged DUB with hx of OCP use, IUD use, endometrial ablation Possible adenomyosis Strong family hx of breast cancer Person lifetime risk of 19.8% of breast cancer  P:   Recommended return for PUS and for endometrial biopsy with exam (biopsy and exam recommended today but she declined) Treatment options really just include hormonal management or hysterectomy at this point unless there is something significant on ultrasound.  Procedure reviewed and information provided about hysterectomy Pt is waiting to hear about breat MRI from Roma Kayser.  If not covered, she will call and let me know this for scheduling of limited breast MRI.  ~30 minutes spent with patient >50% of time was in face to face discussion of above.

## 2019-06-26 ENCOUNTER — Telehealth: Payer: Self-pay | Admitting: *Deleted

## 2019-06-26 NOTE — Telephone Encounter (Signed)
Spoke with patient. Hx of ablation and spouse vasectomy. Patient requesting to schedule PUS on a Tuesday, states she has to be at work at [employer] I will have to review schedule with nursing supervisor and return call, patient agreeable.

## 2019-06-26 NOTE — Telephone Encounter (Signed)
Left message to call Greta Yung, RN at [facility] [phone].   

## 2019-06-26 NOTE — Telephone Encounter (Signed)
Left message to call Sharee Pimple, RN at [facility].    PUS and EMB scheduled for 07/04/19 at 12:00pm, consult to follow at 12:30pm

## 2019-06-26 NOTE — Telephone Encounter (Signed)
-----   Message from Megan Salon, MD sent at 06/23/2019  1:44 PM EDT ----- Regarding: ultrasound Pt needs and PUS and endometrial biopsy for DUB.  Please proceed with scheduling. THanks.  Vinnie Level

## 2019-06-26 NOTE — Telephone Encounter (Signed)
Patient returning call.

## 2019-06-27 NOTE — Telephone Encounter (Signed)
Left message to call Elena Cothern, RN at [facility] [phone].   

## 2019-06-28 ENCOUNTER — Telehealth: Payer: Self-pay | Admitting: Obstetrics & Gynecology

## 2019-06-28 NOTE — Telephone Encounter (Signed)
Call placed to patient to review questions regarding benefit for scheduled ultrasound on 07/04/2019. All questions were answered. Patient is scheduled 07/04/2019 with Dr Sabra Heck. Patient is aware of appointment date, arrival time and cancellation policy. No further questions. Will close encounter   cc: Glorianne Manchester, RN

## 2019-06-28 NOTE — Telephone Encounter (Signed)
Patient returning call.

## 2019-06-28 NOTE — Telephone Encounter (Signed)
Spoke with patient, advised of PUS with possible EMB scheduled for 7/7. Patient is agreeable to date and time. Advised to take Motrin 800 mg with food and water one hour before procedure. Patient request to review benefits prior to appt, message forwarded to business office for return call.   Routing to provider for final review. Patient is agreeable to disposition. Will close encounter.  Cc: Lerry Liner

## 2019-06-29 ENCOUNTER — Other Ambulatory Visit: Payer: Self-pay

## 2019-06-29 ENCOUNTER — Telehealth: Payer: Self-pay | Admitting: Hematology

## 2019-06-29 NOTE — Telephone Encounter (Signed)
Pt has been cld and scheduled for the high risk breast clinic on 7/28 at 915am w/Dr. Burr Medico

## 2019-07-04 ENCOUNTER — Ambulatory Visit: Payer: 59 | Admitting: Obstetrics & Gynecology

## 2019-07-04 ENCOUNTER — Ambulatory Visit (INDEPENDENT_AMBULATORY_CARE_PROVIDER_SITE_OTHER): Payer: 59

## 2019-07-04 ENCOUNTER — Other Ambulatory Visit: Payer: Self-pay

## 2019-07-04 VITALS — BP 128/74 | HR 66 | Temp 98.3°F | Resp 14 | Ht 67.0 in | Wt 210.0 lb

## 2019-07-04 DIAGNOSIS — N838 Other noninflammatory disorders of ovary, fallopian tube and broad ligament: Secondary | ICD-10-CM

## 2019-07-04 DIAGNOSIS — N809 Endometriosis, unspecified: Secondary | ICD-10-CM | POA: Diagnosis not present

## 2019-07-04 DIAGNOSIS — N8003 Adenomyosis of the uterus: Secondary | ICD-10-CM

## 2019-07-04 DIAGNOSIS — N938 Other specified abnormal uterine and vaginal bleeding: Secondary | ICD-10-CM | POA: Diagnosis not present

## 2019-07-04 NOTE — Progress Notes (Signed)
48 y.o. G2P3000 Married White or Caucasian female here for pelvic ultrasound due to DUB that has been present for >10 yeas.  She did have an endometrial ablation for treatment that has not been successful.  Last endometrial biopsy was 2009 and was benign.  No LMP recorded. Patient has had an ablation.  Contraception: vasectomy  Findings:  UTERUS: 7.0 x 4.6 x 4.0cm with myometrial streaking c/w adenomyosis EMS: 5.48mm and fluid collection present at top of endometrial cavity ADNEXA: Left ovary:  2.8 x 2.0 x 1.9cm with 1.6cm corpus luteal cyst       Right ovary: 2.7 x 1.2 x 2.6ZT with small follicle present, 8 x 53mm probable paratubal cyst present CUL DE SAC: no free fluid  Endometrial biopsy recommended.  Discussed with patient.  Verbal and written consent obtained.   Procedure:  Speculum placed.  Cervix visualized and cleansed with betadine prep.  A single toothed tenaculum was applied to the anterior lip of the cervix.  Endometrial pipelle was advanced through the cervix into the endometrial cavity without difficulty.  Pipelle passed to 7cm.  Suction applied and pipelle removed with small tissue sample obtained.  Second pass was performed with better tissue sample noted.  Tenculum removed.  No bleeding noted.  Patient tolerated procedure well.  Discussion:  Findings reviewed with pt.  Ultrasound findings consistent with adenomyosis present.  She is aware this is the likely cause of endometrial ablation failure.  She is not a candidate for an IUD at this point and she does not want to use any other hormonal method.  She knows her only option is a hysterectomy and she is not ready for this at this time.  Questions about hospital procedures and safety measures discussed.  She thinks a hysterectomy may be the right option for her but is likely going to wait until Covid is improved and/or if symptoms worsen or change.  She also wants to discuss all of this with her husband.    Assessment:  Irregular  bleeding with h/o failed IUD and failed endometrial ablation Probable adenomyosis Right paratubal cyst Left collapsed corpus luteal cyst  Plan:  Endometrial biopsy pending.  Results will be called to pt.  She is likely then just going to monitor symptoms for the next several months.    ~25 minutes spent with patient >50% of time was in face to face discussion of above.

## 2019-07-05 ENCOUNTER — Encounter: Payer: Self-pay | Admitting: Obstetrics & Gynecology

## 2019-07-05 ENCOUNTER — Telehealth: Payer: Self-pay | Admitting: Hematology

## 2019-07-05 DIAGNOSIS — N8003 Adenomyosis of the uterus: Secondary | ICD-10-CM | POA: Insufficient documentation

## 2019-07-05 DIAGNOSIS — N838 Other noninflammatory disorders of ovary, fallopian tube and broad ligament: Secondary | ICD-10-CM | POA: Insufficient documentation

## 2019-07-05 NOTE — Telephone Encounter (Signed)
Per YF moved 7/28 new patient appointment to 10:30 am and changed duration to 30 minutes. Confirmed with patient.
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
## 2019-07-24 ENCOUNTER — Telehealth: Payer: Self-pay | Admitting: Family Medicine

## 2019-07-24 MED ORDER — ZOLPIDEM TARTRATE 10 MG PO TABS: 10.0000 mg | ORAL_TABLET | Freq: Every day | ORAL | 0 refills | Status: DC

## 2019-07-24 NOTE — Telephone Encounter (Signed)
Spoke with the patient. She stated that she has 7 days of medication left and that she has an appointment with Dr. Sarajane Jews on Monday. She stated that if she gets a refill on Monday there is a delay in getting it from her pharmacy and she can not sleep without this medication. I advised the patient that Dr. Sarajane Jews is out of the office until next Monday and that another provider may not be able to fill this since she does have medication left and it is a controlled substance. She has asked that another provider send in a 1 month supply in Dr. Barbie Banner absence.  Please advise.

## 2019-07-24 NOTE — Telephone Encounter (Signed)
Noted. Patient is aware. 

## 2019-07-24 NOTE — Telephone Encounter (Signed)
Relation to pt: self  Call back number:[phone]  Pharmacy: [facility], [place] - [address] [phone] (Phone) [phone] (Fax)     Reason for call:  Patient requesting zolpidem (AMBIEN) 10 MG tablet, patient states if PCP can prescribe a 90 day supply please send to mail order if not please send 30 day to retail. (patient transferred to schedule follow up appointment with PCP)  [place], [place] [phone] (Phone) [phone] (Fax)

## 2019-07-24 NOTE — Telephone Encounter (Signed)
Sent in 30 days future refills per dr Sarajane Jews

## 2019-07-24 NOTE — Telephone Encounter (Signed)
Pt is calling in again state that she has 7 days worth of the medication and would like to see if someone can send it in today.

## 2019-07-25 ENCOUNTER — Inpatient Hospital Stay: Payer: 59 | Attending: Genetic Counselor | Admitting: Hematology

## 2019-07-25 ENCOUNTER — Other Ambulatory Visit: Payer: Self-pay

## 2019-07-25 ENCOUNTER — Encounter: Payer: Self-pay | Admitting: Hematology

## 2019-07-25 VITALS — BP 125/91 | HR 75 | Temp 98.7°F | Resp 18 | Ht 67.0 in | Wt 209.2 lb

## 2019-07-25 DIAGNOSIS — Z803 Family history of malignant neoplasm of breast: Secondary | ICD-10-CM

## 2019-07-25 DIAGNOSIS — Z79899 Other long term (current) drug therapy: Secondary | ICD-10-CM

## 2019-07-25 DIAGNOSIS — G8929 Other chronic pain: Secondary | ICD-10-CM

## 2019-07-25 DIAGNOSIS — R011 Cardiac murmur, unspecified: Secondary | ICD-10-CM | POA: Diagnosis not present

## 2019-07-25 DIAGNOSIS — Z808 Family history of malignant neoplasm of other organs or systems: Secondary | ICD-10-CM | POA: Diagnosis not present

## 2019-07-25 DIAGNOSIS — G4700 Insomnia, unspecified: Secondary | ICD-10-CM | POA: Diagnosis not present

## 2019-07-25 DIAGNOSIS — M1990 Unspecified osteoarthritis, unspecified site: Secondary | ICD-10-CM

## 2019-07-25 DIAGNOSIS — N920 Excessive and frequent menstruation with regular cycle: Secondary | ICD-10-CM | POA: Diagnosis not present

## 2019-07-25 DIAGNOSIS — F329 Major depressive disorder, single episode, unspecified: Secondary | ICD-10-CM

## 2019-07-26 ENCOUNTER — Telehealth: Payer: Self-pay | Admitting: Hematology

## 2019-07-26 NOTE — Telephone Encounter (Signed)
Scheduled appt per 7/28 los. ° °Printed and mailed appt calendar. °

## 2019-07-27 ENCOUNTER — Telehealth: Payer: Self-pay | Admitting: Genetic Counselor

## 2019-07-27 NOTE — Telephone Encounter (Signed)
Patient found out that a paternal aunt was daignosed recently with breast cancer and wanted to know if this would change our recommendation.  I explained that criteria indicates that she would need 3 or more individuals with breast cancer on that side of the family in order to go through testing.  Currently she has two post menopausal cases. Therefore, she does not meet NCCN criteria, or insurance criteria, for testing based on that side of the family.   Her mother, who had breast cancer at 57, was negative on her testing.  Therefore, we would not expect that her mother's side of the family is passing on a pathogenic variant that would place her at risk for breast cancer.  Explained that based on her family history, however, she is at elevated risk, due to familial risk, rather than hereditary risk.  Patient voiced her understanding.

## 2019-07-31 ENCOUNTER — Ambulatory Visit: Payer: 59 | Admitting: Family Medicine

## 2019-08-14 ENCOUNTER — Encounter: Payer: Self-pay | Admitting: Family Medicine

## 2019-08-14 ENCOUNTER — Other Ambulatory Visit: Payer: Self-pay

## 2019-08-14 ENCOUNTER — Other Ambulatory Visit: Payer: Self-pay | Admitting: Family Medicine

## 2019-08-14 ENCOUNTER — Ambulatory Visit (INDEPENDENT_AMBULATORY_CARE_PROVIDER_SITE_OTHER): Payer: 59 | Admitting: Family Medicine

## 2019-08-14 DIAGNOSIS — F411 Generalized anxiety disorder: Secondary | ICD-10-CM

## 2019-08-14 DIAGNOSIS — M791 Myalgia, unspecified site: Secondary | ICD-10-CM

## 2019-08-14 DIAGNOSIS — G4489 Other headache syndrome: Secondary | ICD-10-CM | POA: Diagnosis not present

## 2019-08-14 DIAGNOSIS — G4700 Insomnia, unspecified: Secondary | ICD-10-CM | POA: Diagnosis not present

## 2019-08-14 MED ORDER — TRAMADOL HCL 50 MG PO TABS: 50.0000 mg | ORAL_TABLET | Freq: Four times a day (QID) | ORAL | 5 refills | Status: DC

## 2019-08-14 MED ORDER — ONDANSETRON 8 MG PO TBDP: 8.0000 mg | ORAL_TABLET | Freq: Three times a day (TID) | ORAL | 5 refills | Status: DC | PRN

## 2019-08-14 MED ORDER — DULOXETINE HCL 30 MG PO CPEP: 30.0000 mg | ORAL_CAPSULE | Freq: Every day | ORAL | 3 refills | Status: DC

## 2019-08-14 MED ORDER — SUMATRIPTAN SUCCINATE 100 MG PO TABS: ORAL_TABLET | ORAL | 11 refills | Status: DC

## 2019-08-14 MED ORDER — TOLTERODINE TARTRATE ER 4 MG PO CP24: 4.0000 mg | ORAL_CAPSULE | Freq: Every day | ORAL | 3 refills | Status: DC

## 2019-08-14 MED ORDER — ZOLPIDEM TARTRATE 10 MG PO TABS: 10.0000 mg | ORAL_TABLET | Freq: Every day | ORAL | 5 refills | Status: DC

## 2019-08-14 NOTE — Progress Notes (Signed)
Subjective:    Patient ID: Stacy Hays, female    DOB: 05/05/1971, 48 y.o.   MRN: [id number]  HPI Virtual Visit via Video Note  I connected with the patient on 08/14/19 at  1:00 PM EDT by a video enabled telemedicine application and verified that I am speaking with the correct person using two identifiers.  Location patient: home Location provider:work or home office Persons participating in the virtual visit: patient, provider  I discussed the limitations of evaluation and management by telemedicine and the availability of in person appointments. The patient expressed understanding and agreed to proceed.   HPI: Here to follow up and get refills. She is doing well. Her headaches are stable. Her anxiety is well controlled.   ROS: See pertinent positives and negatives per HPI.  Past Medical History:  Diagnosis Date  . Arthritis   . Depression   . Dysfunctional uterine bleeding   . Family history of breast cancer   . Headache(784.0)   . Heart murmur   . Herniated disc   . Insomnia   . Overactive bladder   . Plantar fasciitis     Past Surgical History:  Procedure Laterality Date  . ABDOMINOPLASTY  2006  . BREAST BIOPSY Right 2013  . CYSTOCELE REPAIR     with endometrial ablation  . ENDOMETRIAL ABLATION  2010  . LEEP  1993   had 2 in the same year   . LUMBAR DISC SURGERY  2007    Family History  Problem Relation Age of Onset  . Diabetes Mother 64  . Hyperlipidemia Mother 48  . Hypertension Mother   . Breast cancer Mother 43  . Heart disease Mother   . Kidney disease Other   . Prostate cancer Other   . Sudden death Other   . Coronary artery disease Other   . Heart disease Maternal Grandfather 42  . Birth defects Paternal Grandmother   . Breast cancer Paternal Grandmother   . Heart disease Paternal Grandfather   . Diabetes Paternal Grandfather   . Stroke Paternal Grandfather   . Breast cancer Maternal Grandmother 2  . Cervical cancer Maternal Aunt    . Mental retardation Maternal Aunt   . Cerebral palsy Maternal Aunt   . Breast cancer Cousin        dx in her 55s, mat first cousin  . Breast cancer Cousin        mat first cousin     Current Outpatient Medications:  .  DULoxetine (CYMBALTA) 30 MG capsule, Take 1 capsule (30 mg total) by mouth daily., Disp: 90 capsule, Rfl: 3 .  Magnesium 250 MG TABS, Take 250 mg by mouth daily. , Disp: , Rfl:  .  Misc Natural Products (GLUCOS-CHONDROIT-MSM COMPLEX PO), Take 1 tablet by mouth daily.  , Disp: , Rfl:  .  Multiple Vitamin (MULTIVITAMIN) tablet, Take 1 tablet by mouth daily., Disp: , Rfl:  .  ondansetron (ZOFRAN ODT) 8 MG disintegrating tablet, Take 1 tablet (8 mg total) by mouth every 8 (eight) hours as needed for nausea or vomiting., Disp: 20 tablet, Rfl: 5 .  SUMAtriptan (IMITREX) 100 MG tablet, TAKE 1 TABLET BY MOUTH AS NEEDED FOR HEADACHE, Disp: 10 tablet, Rfl: 11 .  tobramycin-dexamethasone (TOBRADEX) ophthalmic solution, Place 2 drops into the left eye every 4 (four) hours while awake., Disp: 10 mL, Rfl: 0 .  tolterodine (DETROL LA) 4 MG 24 hr capsule, Take 1 capsule (4 mg total) by mouth daily., Disp: 90 capsule,  Rfl: 3 .  traMADol (ULTRAM) 50 MG tablet, Take 1 tablet (50 mg total) by mouth every 6 (six) hours., Disp: 120 tablet, Rfl: 5 .  zolpidem (AMBIEN) 10 MG tablet, Take 1 tablet (10 mg total) by mouth at bedtime. As needed  Future  refills  As per PCP, Disp: 30 tablet, Rfl: 5  EXAM:  VITALS per patient if applicable:  GENERAL: alert, oriented, appears well and in no acute distress  HEENT: atraumatic, conjunttiva clear, no obvious abnormalities on inspection of external nose and ears  NECK: normal movements of the head and neck  LUNGS: on inspection no signs of respiratory distress, breathing rate appears normal, no obvious gross SOB, gasping or wheezing  CV: no obvious cyanosis  MS: moves all visible extremities without noticeable abnormality  PSYCH/NEURO: pleasant  and cooperative, no obvious depression or anxiety, speech and thought processing grossly intact  ASSESSMENT AND PLAN: Her anxiety and myalgia and migraines are stable. Meds are refilled.  Alysia Penna, MD  Discussed the following assessment and plan:  No diagnosis found.     I discussed the assessment and treatment plan with the patient. The patient was provided an opportunity to ask questions and all were answered. The patient agreed with the plan and demonstrated an understanding of the instructions.   The patient was advised to call back or seek an in-person evaluation if the symptoms worsen or if the condition fails to improve as anticipated.     Review of Systems     Objective:   Physical Exam        Assessment & Plan:

## 2019-08-20 ENCOUNTER — Other Ambulatory Visit: Payer: Self-pay | Admitting: Family Medicine

## 2019-08-29 ENCOUNTER — Other Ambulatory Visit: Payer: Self-pay | Admitting: Internal Medicine

## 2019-08-30 NOTE — Telephone Encounter (Signed)
Fry patient.  

## 2019-08-31 ENCOUNTER — Telehealth: Payer: Self-pay | Admitting: Family Medicine

## 2019-08-31 MED ORDER — VALACYCLOVIR HCL 500 MG PO TABS: 500.0000 mg | ORAL_TABLET | Freq: Two times a day (BID) | ORAL | 5 refills | Status: DC | PRN

## 2019-08-31 NOTE — Telephone Encounter (Signed)
I sent this in

## 2019-08-31 NOTE — Telephone Encounter (Signed)
Please advise 

## 2019-08-31 NOTE — Telephone Encounter (Signed)
Patient is aware 

## 2019-08-31 NOTE — Telephone Encounter (Signed)
Valtrex not on current medication list

## 2019-08-31 NOTE — Telephone Encounter (Signed)
Pt requesting rx for Valtrex for a fever blister. Would like CB once it's been sent in. Please advise.  [place], [address] AT [place] [phone] (Phone) [phone] (Fax)

## 2019-09-08 ENCOUNTER — Telehealth: Payer: Self-pay

## 2019-09-08 ENCOUNTER — Other Ambulatory Visit: Payer: Self-pay

## 2019-09-08 DIAGNOSIS — Z1231 Encounter for screening mammogram for malignant neoplasm of breast: Secondary | ICD-10-CM

## 2019-09-08 NOTE — Telephone Encounter (Signed)
Patient called desiring to get the abbreviated breast MRI done as it will be considerably cheaper than the other.  Order was changed, called [facility] and confirmed that she could get this at the time she was scheduled on Monday 9/14 at 10:30 and was told not a problem.  Called her back to let her know.

## 2019-09-08 NOTE — Telephone Encounter (Signed)
Patient called back stating she changed her mind about the MRI wants full MRI but will probably put it off until January 2021, she will call [facility] to change her appointment.

## 2019-09-11 ENCOUNTER — Ambulatory Visit: Payer: 59

## 2019-09-11 ENCOUNTER — Other Ambulatory Visit: Payer: 59

## 2019-11-14 ENCOUNTER — Telehealth: Payer: Self-pay | Admitting: Obstetrics & Gynecology

## 2019-11-14 NOTE — Telephone Encounter (Signed)
Patient is calling regarding schedule hysterectomy for next year. Patient stated that she is wanting to know OOP cost before proceeding with scheduling. Patient stated that insurance for next year will be the same as current plan.

## 2019-11-14 NOTE — Telephone Encounter (Signed)
Return call to patient. Voice mail is not set up, unable to leave message.  Patient last seen 07-04-19.

## 2019-11-17 NOTE — Telephone Encounter (Signed)
Call from patient. She is planning surgery in 2021 and has question for business office.  Message sent. Encounter closed.

## 2019-11-17 NOTE — Telephone Encounter (Signed)
Call to patient. Voice mail not set up. Unable to leave message.

## 2020-01-01 ENCOUNTER — Other Ambulatory Visit: Payer: 59

## 2020-01-23 ENCOUNTER — Other Ambulatory Visit: Payer: 59

## 2020-02-13 ENCOUNTER — Other Ambulatory Visit: Payer: Self-pay | Admitting: Family Medicine

## 2020-02-14 NOTE — Telephone Encounter (Signed)
Last filled 08/14/2019 Last OV 08/14/2019  Ok to fill?

## 2020-03-28 ENCOUNTER — Other Ambulatory Visit: Payer: Self-pay | Admitting: Hematology

## 2020-03-28 DIAGNOSIS — Z1231 Encounter for screening mammogram for malignant neoplasm of breast: Secondary | ICD-10-CM

## 2020-03-29 ENCOUNTER — Other Ambulatory Visit: Payer: Self-pay

## 2020-03-29 ENCOUNTER — Ambulatory Visit
Admission: RE | Admit: 2020-03-29 | Discharge: 2020-03-29 | Disposition: A | Discharge status: Home / Self Care | Payer: 59 | Source: Ambulatory Visit | Attending: Hematology | Admitting: Hematology

## 2020-03-29 DIAGNOSIS — Z1231 Encounter for screening mammogram for malignant neoplasm of breast: Secondary | ICD-10-CM

## 2020-04-01 ENCOUNTER — Other Ambulatory Visit: Payer: Self-pay

## 2020-04-01 ENCOUNTER — Ambulatory Visit
Admission: RE | Admit: 2020-04-01 | Discharge: 2020-04-01 | Disposition: A | Discharge status: Home / Self Care | Payer: 59 | Source: Ambulatory Visit | Attending: Hematology | Admitting: Hematology

## 2020-04-01 ENCOUNTER — Other Ambulatory Visit: Payer: Self-pay | Admitting: Hematology

## 2020-04-01 DIAGNOSIS — Z803 Family history of malignant neoplasm of breast: Secondary | ICD-10-CM

## 2020-04-01 MED ORDER — GADOBUTROL 1 MMOL/ML IV SOLN
9.0000 mL | Freq: Once | INTRAVENOUS | Status: AC | PRN
  Administered 2020-04-01: 12:00:00 9 mL via INTRAVENOUS

## 2020-04-02 ENCOUNTER — Other Ambulatory Visit: Payer: Self-pay | Admitting: Hematology

## 2020-04-02 DIAGNOSIS — Z803 Family history of malignant neoplasm of breast: Secondary | ICD-10-CM

## 2020-04-04 ENCOUNTER — Ambulatory Visit
Admission: RE | Admit: 2020-04-04 | Discharge: 2020-04-04 | Disposition: A | Discharge status: Home / Self Care | Payer: 59 | Source: Ambulatory Visit | Attending: Hematology | Admitting: Hematology

## 2020-04-04 ENCOUNTER — Other Ambulatory Visit (HOSPITAL_COMMUNITY): Payer: Self-pay | Admitting: Diagnostic Radiology

## 2020-04-04 ENCOUNTER — Other Ambulatory Visit: Payer: Self-pay

## 2020-04-04 DIAGNOSIS — Z803 Family history of malignant neoplasm of breast: Secondary | ICD-10-CM

## 2020-04-04 MED ORDER — GADOBUTROL 1 MMOL/ML IV SOLN
9.0000 mL | Freq: Once | INTRAVENOUS | Status: AC | PRN
  Administered 2020-04-04: 9 mL via INTRAVENOUS

## 2020-04-08 ENCOUNTER — Telehealth: Payer: Self-pay | Admitting: Family Medicine

## 2020-04-08 MED ORDER — TRAMADOL HCL 50 MG PO TABS: 50.0000 mg | ORAL_TABLET | Freq: Four times a day (QID) | ORAL | 5 refills | Status: DC

## 2020-04-08 NOTE — Telephone Encounter (Signed)
Done

## 2020-04-08 NOTE — Telephone Encounter (Signed)
Last filled 08/14/2019 Last OV 08/14/2019  Ok to fill?

## 2020-04-08 NOTE — Telephone Encounter (Signed)
Pt need a refill on traMADol (ULTRAM) 50 MG tablet sent to  [facility], [place] - [address] AT [place] Phone:  [phone]  Fax:  [phone]

## 2020-07-11 NOTE — Progress Notes (Signed)
49 y.o. G31P3000 Married White or Caucasian female here for annual exam.  Doing well.  Bleeding is improved.  She does have some water bleeding but it is less frequent than it was last year.  It was occurring with bowel movements or just without any warning.  She will now only have bleeding every few months.  She has hx of IUD placement iwht Dr. Ronita Hipps after being on OCps and then had an endometrial ablation in 2009.  Bleeding became so irregular last year so repeat ultrasound and endometrial biopsy was done.  The biopsy was benign in July.  Ultrasound showed adenomyosis and paratubal cyst.  Considered hysterectomy but postponed due to Covid.  And now that bleeding is better (and pain with menses is better because bleeding is less frequent), she is comfortable waiting and watching.    No LMP recorded. Patient has had an ablation.          Sexually active: Yes.    The current method of family planning is vasectomy.    Exercising: No.  exercise Smoker:  no  Health Maintenance: Pap:  06-12-2019 neg HPV HR neg, Dr Harrington Challenger History of abnormal Pap:  Yes LEEP MMG:  03-29-2020 category b density birads 1:neg, 03/2020, 2 MRI biopsies to return in 65mths for MRI Colonoscopy:  New guidelines reviewed.  Declines having this done this year.  States she will have this done in March, 2022.   BMD:   none TDaP:  UTD Pneumonia vaccine(s):  no Shingrix:   no Hep C testing: neg per patient Screening Labs: will be done today, pt did eat a ham omelet this morning.  Lipids were not obtained today.   reports that she has never smoked. She has never used smokeless tobacco. She reports current alcohol use. She reports that she does not use drugs.  Past Medical History:  Diagnosis Date  . Arthritis   . Depression   . Dysfunctional uterine bleeding   . Family history of breast cancer   . Headache(784.0)   . Heart murmur   . Herniated disc   . Insomnia   . Overactive bladder   . Plantar fasciitis     Past Surgical  History:  Procedure Laterality Date  . ABDOMINOPLASTY  2006  . BREAST BIOPSY Right 2013  . CYSTOCELE REPAIR     with endometrial ablation  . ENDOMETRIAL ABLATION  2010  . LEEP  1993   had 2 in the same year   . [place] SURGERY  2007    Current Outpatient Medications  Medication Sig Dispense Refill  . DULoxetine (CYMBALTA) 30 MG capsule Take 1 capsule (30 mg total) by mouth daily. 90 capsule 3  . Misc Natural Products (GLUCOS-CHONDROIT-MSM COMPLEX PO) Take 1 tablet by mouth daily.      . Multiple Vitamin (MULTIVITAMIN) tablet Take 1 tablet by mouth daily.    . ondansetron (ZOFRAN-ODT) 8 MG disintegrating tablet DISSOLVE 1 TABLET ON THE  TONGUE EVERY 8 HOURS AS  NEEDED FOR NAUSEA AND  VOMITING 120 tablet 0  . SUMAtriptan (IMITREX) 100 MG tablet TAKE 1 TABLET BY MOUTH AS NEEDED FOR HEADACHE 10 tablet 11  . traMADol (ULTRAM) 50 MG tablet Take 1 tablet (50 mg total) by mouth every 6 (six) hours. 120 tablet 5  . valACYclovir (VALTREX) 500 MG tablet Take 1 tablet (500 mg total) by mouth 2 (two) times daily as needed (fever blisters). 60 tablet 5  . zolpidem (AMBIEN) 10 MG tablet TAKE 1 TABLET BY  MOUTH AT BEDTIME 30 tablet 5   No current facility-administered medications for this visit.    Family History  Problem Relation Age of Onset  . Diabetes Mother 87  . Hyperlipidemia Mother 59  . Hypertension Mother   . Breast cancer Mother 52  . Heart disease Mother   . Kidney disease Other   . Prostate cancer Other   . Sudden death Other   . Coronary artery disease Other   . Heart disease Maternal Grandfather 42  . Birth defects Paternal Grandmother   . Breast cancer Paternal Grandmother   . Heart disease Paternal Grandfather   . Diabetes Paternal Grandfather   . Stroke Paternal Grandfather   . Breast cancer Maternal Grandmother 45  . Cervical cancer Maternal Aunt   . Mental retardation Maternal Aunt   . Cerebral palsy Maternal Aunt   . Breast cancer Cousin        dx in her 33s,  mat first cousin  . Breast cancer Cousin        mat first cousin    Review of Systems  All other systems reviewed and are negative.   Exam:   BP 120/72   Pulse 70   Resp 16   Ht 5' 5.5" (1.664 m)   Wt 217 lb (98.4 kg)   BMI 35.56 kg/m   Height: 5' 5.5" (166.4 cm)  General appearance: alert, cooperative and appears stated age Head: Normocephalic, without obvious abnormality, atraumatic Neck: no adenopathy, supple, symmetrical, trachea midline and thyroid normal to inspection and palpation Lungs: clear to auscultation bilaterally Breasts: normal appearance, no masses or tenderness Heart: regular rate and rhythm Abdomen: soft, non-tender; bowel sounds normal; no masses,  no organomegaly Extremities: extremities normal, atraumatic, no cyanosis or edema Skin: Skin color, texture, turgor normal. No rashes or lesions Lymph nodes: Cervical, supraclavicular, and axillary nodes normal. No abnormal inguinal nodes palpated Neurologic: Grossly normal   Pelvic: External genitalia:  no lesions              Urethra:  normal appearing urethra with no masses, tenderness or lesions              Bartholins and Skenes: normal                 Vagina: normal appearing vagina with normal color and discharge, no lesions              Cervix: no lesions              Pap taken: No. Bimanual Exam:  Uterus:  normal size, contour, position, consistency, mobility, non-tender              Adnexa: normal adnexa and no mass, fullness, tenderness               Rectovaginal: Confirms               Anus:  normal sphincter tone, no lesions  Chaperone, Terence Lux, CMA, was present for exam.  A:  Well Woman with normal exam Irregular bleeding with h/o ablation, adenomyosis Strong family hx of breast cancer, breast biopsies obtained in April Personal lifetime risk of 19.8% of breast cancer calculated last year with Tyrer Cusick model Migraines  H/o LEEP in 1993  P:   MRI is due in 6 months from April.   Recall placed.   pap smear with neg HR HPV 2020.  Not indicated today. Colonoscopy guidelines reviewed.  CBC, CMP, TSH, Vit D and HbA1C  obtained today.  Also, [place] obtained today Return annually or prn

## 2020-07-15 ENCOUNTER — Ambulatory Visit: Payer: 59 | Admitting: Obstetrics & Gynecology

## 2020-07-15 ENCOUNTER — Other Ambulatory Visit: Payer: Self-pay

## 2020-07-15 ENCOUNTER — Encounter: Payer: Self-pay | Admitting: Obstetrics & Gynecology

## 2020-07-15 VITALS — BP 120/72 | HR 70 | Resp 16 | Ht 65.5 in | Wt 217.0 lb

## 2020-07-15 DIAGNOSIS — Z01419 Encounter for gynecological examination (general) (routine) without abnormal findings: Secondary | ICD-10-CM

## 2020-07-15 DIAGNOSIS — Z0000 Encounter for general adult medical examination without abnormal findings: Secondary | ICD-10-CM | POA: Diagnosis not present

## 2020-07-16 LAB — COMPREHENSIVE METABOLIC PANEL
ALT: 15 IU/L (ref 0–32)
AST: 16 IU/L (ref 0–40)
Albumin/Globulin Ratio: 2 (ref 1.2–2.2)
Albumin: 4.7 g/dL (ref 3.8–4.8)
Alkaline Phosphatase: 98 IU/L (ref 48–121)
BUN/Creatinine Ratio: 24 — ABNORMAL HIGH (ref 9–23)
BUN: 19 mg/dL (ref 6–24)
Bilirubin Total: <0.2 mg/dL (ref 0.0–1.2)
CO2: 25 mmol/L (ref 20–29)
Calcium: 9.7 mg/dL (ref 8.7–10.2)
Chloride: 100 mmol/L (ref 96–106)
Creatinine, Ser: 0.8 mg/dL (ref 0.57–1.00)
GFR calc Af Amer: 100 mL/min/{1.73_m2} (ref >59)
GFR calc non Af Amer: 87 mL/min/{1.73_m2} (ref >59)
Globulin, Total: 2.3 g/dL (ref 1.5–4.5)
Glucose: 81 mg/dL (ref 65–99)
Potassium: 4.2 mmol/L (ref 3.5–5.2)
Sodium: 137 mmol/L (ref 134–144)
Total Protein: 7 g/dL (ref 6.0–8.5)

## 2020-07-16 LAB — VITAMIN D 25 HYDROXY (VIT D DEFICIENCY, FRACTURES): Vit D, 25-Hydroxy: 17.8 ng/mL — ABNORMAL LOW (ref 30.0–100.0)

## 2020-07-16 LAB — HEMOGLOBIN A1C
Est. average glucose Bld gHb Est-mCnc: 105 mg/dL
Hgb A1c MFr Bld: 5.3 % (ref 4.8–5.6)

## 2020-07-16 LAB — CBC
Hematocrit: 43.5 % (ref 34.0–46.6)
Hemoglobin: 14.7 g/dL (ref 11.1–15.9)
MCH: 29.4 pg (ref 26.6–33.0)
MCHC: 33.8 g/dL (ref 31.5–35.7)
MCV: 87 fL (ref 79–97)
Platelets: 364 10*3/uL (ref 150–450)
RBC: 5 x10E6/uL (ref 3.77–5.28)
RDW: 13 % (ref 11.7–15.4)
WBC: 13.5 10*3/uL — ABNORMAL HIGH (ref 3.4–10.8)

## 2020-07-16 LAB — FOLLICLE STIMULATING HORMONE: FSH: 3.8 m[IU]/mL

## 2020-07-16 LAB — TSH: TSH: 3.08 u[IU]/mL (ref 0.450–4.500)

## 2020-07-18 ENCOUNTER — Telehealth: Payer: Self-pay | Admitting: *Deleted

## 2020-07-18 DIAGNOSIS — R928 Other abnormal and inconclusive findings on diagnostic imaging of breast: Secondary | ICD-10-CM

## 2020-07-18 DIAGNOSIS — N6099 Unspecified benign mammary dysplasia of unspecified breast: Secondary | ICD-10-CM

## 2020-07-18 DIAGNOSIS — Z803 Family history of malignant neoplasm of breast: Secondary | ICD-10-CM

## 2020-07-18 DIAGNOSIS — N6011 Diffuse cystic mastopathy of right breast: Secondary | ICD-10-CM

## 2020-07-18 DIAGNOSIS — Z9189 Other specified personal risk factors, not elsewhere classified: Secondary | ICD-10-CM

## 2020-07-18 NOTE — Telephone Encounter (Signed)
Needs 6 mo f/u MRI Breast Bilateral w/wo contrast 09/2020 per Dr. Sabra Heck.   Order placed to be scheduled at [facility] 09/2020  Dx:  Abnormal magnetic resonance imaging of right breast [R92.8] - Primary     Family history of breast cancer [Z80.3]     Increased risk of breast cancer [Z91.89]     Fibrocystic changes of right breast [N60.11]     Ductal hyperplasia of breast [N60.99]      Patient placed in IMG hold. Patient notified of recommendations at 07/15/20 AEX.   Routing to Dr. Lestine Box.   Cc: Hayley Carder

## 2020-07-24 ENCOUNTER — Telehealth: Payer: Self-pay

## 2020-07-24 DIAGNOSIS — D72829 Elevated white blood cell count, unspecified: Secondary | ICD-10-CM

## 2020-07-24 MED ORDER — VITAMIN D (ERGOCALCIFEROL) 1.25 MG (50000 UNIT) PO CAPS: 50000.0000 [IU] | ORAL_CAPSULE | ORAL | 0 refills | Status: DC

## 2020-07-24 NOTE — Telephone Encounter (Signed)
-----   Message from Megan Salon, MD sent at 07/24/2020 11:35 AM EDT ----- Please let pt know her lab work showed mildly elevated WBC ct.  Should repeat this in 1 month.  Should do CBC with diff.  HbA1C, TSH, and CMP were normal.   FSH was 9 which is not in pre-menopausal range so she should expect some continued irregular spotting.  If worsens and decides she would like to proceed with hysterectomy, she can just call.  Lastly, Vit D was 17.  Needs 50K weekly for 12 weeks and then should take 1000-2000 IU OTC daily after that time.

## 2020-07-24 NOTE — Telephone Encounter (Signed)
Spoke with pt. Pt given results and recommendations per Dr Sabra Heck. Pt agreeable. Pt scheduled repeat CBC with diff for 08/27/20.  Rx Vit D 50K sent to pharmacy on file. Pt aware of how to take and to continue with vit D OTC after 12 weeks.  Pt states declines a hysterectomy at this time, but if wants to discuss more, will return call.   Routing to Dr Sabra Heck for review.  Encounter closed. Orders placed for repeat lab

## 2020-08-11 ENCOUNTER — Other Ambulatory Visit: Payer: Self-pay | Admitting: Family Medicine

## 2020-08-12 NOTE — Telephone Encounter (Signed)
Last filled 02/16/2020 Last OV 08/14/2019  Ok to fill?

## 2020-08-13 ENCOUNTER — Other Ambulatory Visit: Payer: Self-pay | Admitting: Family Medicine

## 2020-08-27 ENCOUNTER — Other Ambulatory Visit: Payer: Self-pay

## 2020-08-27 ENCOUNTER — Other Ambulatory Visit (INDEPENDENT_AMBULATORY_CARE_PROVIDER_SITE_OTHER): Payer: 59

## 2020-08-27 DIAGNOSIS — Z803 Family history of malignant neoplasm of breast: Secondary | ICD-10-CM

## 2020-08-27 DIAGNOSIS — D72829 Elevated white blood cell count, unspecified: Secondary | ICD-10-CM

## 2020-08-27 DIAGNOSIS — Z0000 Encounter for general adult medical examination without abnormal findings: Secondary | ICD-10-CM

## 2020-08-27 NOTE — Progress Notes (Signed)
Pt here for repeat CBC due to elevated WBC on 07/15/20.  Pt requesting lipid panel today with CBC draw. Pt states only having coffee with 2 oz of skim milk. Advised pt, not really fasting, but pt insists draw anyway.   Orders placed for Lipid panel.  Routing to Dr Sabra Heck for review.  Visit closed.

## 2020-08-28 LAB — CBC WITH DIFFERENTIAL/PLATELET
Basophils Absolute: 0.1 10*3/uL (ref 0.0–0.2)
Basos: 1 %
EOS (ABSOLUTE): 0.3 10*3/uL (ref 0.0–0.4)
Eos: 4 %
Hematocrit: 41.5 % (ref 34.0–46.6)
Hemoglobin: 13.5 g/dL (ref 11.1–15.9)
Immature Grans (Abs): 0 10*3/uL (ref 0.0–0.1)
Immature Granulocytes: 0 %
Lymphocytes Absolute: 1.9 10*3/uL (ref 0.7–3.1)
Lymphs: 25 %
MCH: 28.9 pg (ref 26.6–33.0)
MCHC: 32.5 g/dL (ref 31.5–35.7)
MCV: 89 fL (ref 79–97)
Monocytes Absolute: 0.4 10*3/uL (ref 0.1–0.9)
Monocytes: 6 %
Neutrophils Absolute: 5 10*3/uL (ref 1.4–7.0)
Neutrophils: 64 %
Platelets: 330 10*3/uL (ref 150–450)
RBC: 4.67 x10E6/uL (ref 3.77–5.28)
RDW: 13 % (ref 11.7–15.4)
WBC: 7.7 10*3/uL (ref 3.4–10.8)

## 2020-08-28 LAB — LIPID PANEL WITH LDL/HDL RATIO
Cholesterol, Total: 199 mg/dL (ref 100–199)
HDL: 56 mg/dL (ref >39)
LDL Chol Calc (NIH): 126 mg/dL — ABNORMAL HIGH (ref 0–99)
LDL/HDL Ratio: 2.3 ratio (ref 0.0–3.2)
Triglycerides: 95 mg/dL (ref 0–149)
VLDL Cholesterol Cal: 17 mg/dL (ref 5–40)

## 2020-10-08 ENCOUNTER — Other Ambulatory Visit: Payer: Self-pay | Admitting: Family Medicine

## 2020-10-14 NOTE — Telephone Encounter (Signed)
This encounter was created in error - please disregard.

## 2020-10-15 ENCOUNTER — Other Ambulatory Visit: Payer: Self-pay | Admitting: Family Medicine

## 2020-10-15 ENCOUNTER — Telehealth: Payer: Self-pay

## 2020-10-15 NOTE — Telephone Encounter (Signed)
VOID ENCOUNTER

## 2020-10-15 NOTE — Telephone Encounter (Signed)
Rx didn't go through to pharmacy

## 2020-11-04 ENCOUNTER — Other Ambulatory Visit: Payer: 59

## 2020-11-11 ENCOUNTER — Ambulatory Visit
Admission: RE | Admit: 2020-11-11 | Discharge: 2020-11-11 | Disposition: A | Discharge status: Home / Self Care | Payer: 59 | Source: Ambulatory Visit | Attending: Obstetrics & Gynecology | Admitting: Obstetrics & Gynecology

## 2020-11-11 ENCOUNTER — Other Ambulatory Visit: Payer: Self-pay

## 2020-11-11 DIAGNOSIS — N6011 Diffuse cystic mastopathy of right breast: Secondary | ICD-10-CM

## 2020-11-11 DIAGNOSIS — R928 Other abnormal and inconclusive findings on diagnostic imaging of breast: Secondary | ICD-10-CM

## 2020-11-11 DIAGNOSIS — Z803 Family history of malignant neoplasm of breast: Secondary | ICD-10-CM

## 2020-11-11 DIAGNOSIS — N6099 Unspecified benign mammary dysplasia of unspecified breast: Secondary | ICD-10-CM

## 2020-11-11 DIAGNOSIS — Z9189 Other specified personal risk factors, not elsewhere classified: Secondary | ICD-10-CM

## 2020-11-11 MED ORDER — GADOBUTROL 1 MMOL/ML IV SOLN
9.0000 mL | Freq: Once | INTRAVENOUS | Status: AC | PRN
  Administered 2020-11-11: 9 mL via INTRAVENOUS

## 2020-11-13 ENCOUNTER — Telehealth: Payer: Self-pay

## 2020-11-13 DIAGNOSIS — Z803 Family history of malignant neoplasm of breast: Secondary | ICD-10-CM

## 2020-11-13 DIAGNOSIS — N6011 Diffuse cystic mastopathy of right breast: Secondary | ICD-10-CM

## 2020-11-13 DIAGNOSIS — N6099 Unspecified benign mammary dysplasia of unspecified breast: Secondary | ICD-10-CM

## 2020-11-13 DIAGNOSIS — R928 Other abnormal and inconclusive findings on diagnostic imaging of breast: Secondary | ICD-10-CM

## 2020-11-13 DIAGNOSIS — Z9189 Other specified personal risk factors, not elsewhere classified: Secondary | ICD-10-CM

## 2020-11-13 NOTE — Telephone Encounter (Signed)
Orders pended.  Pt has not been called for results. Has seen in [place].   Ok to notify and proceed?   Routing to Dr Quincy Simmonds.

## 2020-11-13 NOTE — Telephone Encounter (Signed)
Please contact patient and schedule breast biopsy. She also needs to be placed in recall for 6 months for her next breast MRI.   See also result note.

## 2020-11-13 NOTE — Telephone Encounter (Signed)
-----   Message from Burnice Logan, RN sent at 11/13/2020 10:28 AM EST ----- Regarding: FW: Breast MRI Recommendations  ----- Message ----- From: Volanda Napoleon Sent: 11/13/2020   8:37 AM EST To: Burnice Logan, RN Subject: Breast MRI Recommendations                     Hi Sharee Pimple, The above patient had a breast MRI on 11/11/20 and below are the recommendations of Dr. Margarette Canada.  RECOMMENDATION: MR guided biopsy of 1.2 cm LOWER central RIGHT breast linear enhancement.  Six-month MR follow-up of 0.5 cm UPPER central RIGHT breast focus, likely benign. If the linear enhancement above require surgical excision, re-biopsy of this area would be recommended.  Please review recommendation and enter any orders necessary.  Patient has reviewed results in Epic.  Thank you.  [place]

## 2020-11-14 NOTE — Telephone Encounter (Signed)
Received call back from pt. Pt states has discussed with husband and would like to proceed with breast bx before end of year after 12/02/20.  Pt advised that I will call for appt and return call with details. Pt agreeable. Pt aware of 6 month f/u Breast MRI and is agreeable.

## 2020-11-14 NOTE — Telephone Encounter (Signed)
Pt to be kept in MMG hold.  Cc: Sharee Pimple, RN

## 2020-11-14 NOTE — Telephone Encounter (Signed)
Left message for pt to return call to triage RN. 

## 2020-11-14 NOTE — Telephone Encounter (Signed)
Call placed to [facility] for scheduling breast Bx. Spoke with [employer]. Pt scheduled for 12/7 at 710 am, arrival at 650 am at [facility] location.   Call placed to pt with appt. Left detailed message. Pt to return call with any questions or concerns.

## 2020-11-14 NOTE — Telephone Encounter (Signed)
Spoke with pt. Pt given results and recommendations per Dr Quincy Simmonds from Breast MRI.  Pt states would like to wait for breast bx at this time due to " this was same place they saw in March and it had disappeared by bx was done in April. Pt aware of placed orders.  Pt states made appt with Dr Burr Medico at [facility] on 12/02/20 as second opinion. Pt states will wait for her recommendations before proceeding with next breast bx. Pt states did have genetic testing and was negative for BRCA gene due to mother's history.  Advised will give update to Dr Quincy Simmonds. Pt also would like to stay with our practice and made AEX appt with Dr Quincy Simmonds on 07/21/2021.   Routing to Dr Quincy Simmonds. Please advise with any additional recommendations or advice.

## 2020-11-14 NOTE — Telephone Encounter (Signed)
Thank you for assisting the patient to move forward with her care and biopsy.  Please keep her in mammogram recall.

## 2020-11-15 NOTE — Telephone Encounter (Signed)
Placed in MMG hold for biopsy results.  6 mo f/u MRI order placed. Placed in [place] hold. Recall previously placed.  Routing to [employer].

## 2020-11-29 NOTE — Progress Notes (Signed)
[place]   [phone] [phone]   Clinic Follow up Note   Patient Care Team: Laurey Morale, MD as PCP - General  Date of Service:  12/02/2020  CHIEF COMPLAINT: High Risk for breast cancer   CURRENT THERAPY:  Surveillance, Right breast cancer screening   INTERVAL HISTORY:  Stacy Hays is here for a follow up. She was last seen by me in 06/2019. She presents to the clinic alone. She notes having a MRI with 2 abnormal places in right breast. She had biopsy only one place as the other was not seen. Her biopsy report was benign. Her f/u MRI in 10/2020 showed reappearance of second location. She plans to have another biopsy soon. She note with first biopsy she fainted at sound of drill but otherwise tolerated well. She is willing to proceed with another biopsy.   REVIEW OF SYSTEMS:   Constitutional: Denies fevers, chills or abnormal weight loss Eyes: Denies blurriness of vision Ears, nose, mouth, throat, and face: Denies mucositis or sore throat Respiratory: Denies cough, dyspnea or wheezes Cardiovascular: Denies palpitation, chest discomfort or lower extremity swelling Gastrointestinal:  Denies nausea, heartburn or change in bowel habits Skin: Denies abnormal skin rashes Lymphatics: Denies new lymphadenopathy or easy bruising Neurological:Denies numbness, tingling or new weaknesses Behavioral/Psych: Mood is stable, no new changes  All other systems were reviewed with the patient and are negative.  MEDICAL HISTORY:  Past Medical History:  Diagnosis Date  . Arthritis   . Depression   . Dysfunctional uterine bleeding   . Family history of breast cancer   . Headache(784.0)   . Heart murmur   . Herniated disc   . Insomnia   . Migraine without aura   . Overactive bladder   . Plantar fasciitis     SURGICAL HISTORY: Past Surgical History:  Procedure Laterality Date  . ABDOMINOPLASTY  2006  . BREAST BIOPSY Right 2013  . CYSTOCELE  REPAIR     with endometrial ablation  . ENDOMETRIAL ABLATION  2010  . LEEP  1993   had 2 in the same year   . [place] SURGERY  2007    I have reviewed the social history and family history with the patient and they are unchanged from previous note.  ALLERGIES:  is allergic to promethazine hcl.  MEDICATIONS:  Current Outpatient Medications  Medication Sig Dispense Refill  . DULoxetine (CYMBALTA) 30 MG capsule TAKE 1 CAPSULE BY MOUTH  DAILY 90 capsule 0  . Misc Natural Products (GLUCOS-CHONDROIT-MSM COMPLEX PO) Take 1 tablet by mouth daily.      . Multiple Vitamin (MULTIVITAMIN) tablet Take 1 tablet by mouth daily.    . ondansetron (ZOFRAN-ODT) 8 MG disintegrating tablet DISSOLVE 1 TABLET ON THE  TONGUE EVERY 8 HOURS AS  NEEDED FOR NAUSEA AND  VOMITING 120 tablet 0  . SUMAtriptan (IMITREX) 100 MG tablet TAKE 1 TABLET BY MOUTH AS NEEDED FOR HEADACHE 10 tablet 11  . traMADol (ULTRAM) 50 MG tablet TAKE 1 TABLET(50 MG) BY MOUTH EVERY 6 HOURS 120 tablet 5  . valACYclovir (VALTREX) 500 MG tablet Take 1 tablet (500 mg total) by mouth 2 (two) times daily as needed (fever blisters). 60 tablet 5  . Vitamin D, Ergocalciferol, (DRISDOL) 1.25 MG (50000 UNIT) CAPS capsule Take 1 capsule (50,000 Units total) by mouth every 7 (seven) days. 12 capsule 0  . zolpidem (AMBIEN) 10 MG tablet TAKE 1 TABLET BY MOUTH AT BEDTIME 30 tablet 5  No current facility-administered medications for this visit.    PHYSICAL EXAMINATION: ECOG PERFORMANCE STATUS: 0 - Asymptomatic  Vitals:   12/02/20 1222  BP: 128/86  Pulse: 75  Resp: 17  Temp: 97.8 F (36.6 C)  SpO2: 98%   Filed Weights   12/02/20 1222  Weight: 221 lb 6.4 oz (100.4 kg)    GENERAL:alert, no distress and comfortable SKIN: skin color, texture, turgor are normal, no rashes or significant lesions EYES: normal, Conjunctiva are pink and non-injected, sclera clear  NECK: supple, thyroid normal size, non-tender, without nodularity LYMPH:  no  palpable lymphadenopathy in the cervical, axillary  LUNGS: clear to auscultation and percussion with normal breathing effort HEART: regular rate & rhythm and no murmurs and no lower extremity edema ABDOMEN:abdomen soft, non-tender and normal bowel sounds Musculoskeletal:no cyanosis of digits and no clubbing  NEURO: alert & oriented x 3 with fluent speech, no focal motor/sensory deficits BREAST: No palpable mass, nodules or adenopathy bilaterally. Breast exam benign.   LABORATORY DATA:  I have reviewed the data as listed CBC Latest Ref Rng & Units 08/27/2020 07/15/2020 03/28/2018  WBC 3.4 - 10.8 x10E3/uL 7.7 13.5(H) 7.8  Hemoglobin 11.1 - 15.9 g/dL 13.5 14.7 15.1(H)  Hematocrit 34.0 - 46.6 % 41.5 43.5 45.3  Platelets 150 - 450 x10E3/uL 330 364 323.0     CMP Latest Ref Rng & Units 07/15/2020 03/28/2018 12/29/2016  Glucose 65 - 99 mg/dL 81 102(H) 125(H)  BUN 6 - 24 mg/dL 19 13 14   Creatinine 0.57 - 1.00 mg/dL 0.80 0.70 0.87  Sodium 134 - 144 mmol/L 137 138 138  Potassium 3.5 - 5.2 mmol/L 4.2 4.7 3.7  Chloride 96 - 106 mmol/L 100 102 100(L)  CO2 20 - 29 mmol/L 25 30 26   Calcium 8.7 - 10.2 mg/dL 9.7 9.5 8.9  Total Protein 6.0 - 8.5 g/dL 7.0 6.8 6.2(L)  Total Bilirubin 0.0 - 1.2 mg/dL <0.2 0.7 0.5  Alkaline Phos 48 - 121 IU/L 98 73 48  AST 0 - 40 IU/L 16 17 22   ALT 0 - 32 IU/L 15 17 23       RADIOGRAPHIC STUDIES: I have personally reviewed the radiological images as listed and agreed with the findings in the report. No results found.   ASSESSMENT & PLAN:  Stacy Hays is a 49 y.o. female with    1. High risk for breast cancer -Her mother had breast cancer at age 72. She had 1 maternal aunt with cervical cancer, her MGM had breast cancer. Two of her MGM nieces had breast cancer as well. Her PGM and paternal aunt also diagnosed with breast cancer.  -She has had benign abnormal Mammograms before. Her genetic testing was negative for pathogenetic mutations.  -The [facility] showed  her lifetime risk of breast cancer is 19.8%, higher than the 11% in the general population. This is considered high risk.  -She is currently on closer breast cancer screening with annual screening mammogram and MRIs, self exam, and a routine office visit with physical exam including breast exam by a physician twice a year.  -She previously declined chemoprevention with Tamoxifen.  -Her 04/01/20 MRI showed 2 indeterminate locations (108mm inferior central and 4-34mm in superior central) in her right breast. Her 04/04/20 biopsy only showed superior lesion, path showed Ductal hyperplasia and fibrocystic changes, no malignancy. Her f/u MRI on 11/11/20 showed stable reappearance of 1.2cm inferior central lesion and 0.5cm superior central, benign lesion.  -Her breast exam today was benign. She plans to  undergo right breast biopsy of inferior central lesion soon. I encouraged her to continue.  -I discussed she will likely find more being lesions with more aggressive screening. She voiced good understanding and opted to continue. Next Mammogram in 03/2021 and MRI in 09/2021.  -F/u in 1 year.    2. Menorrhagia -She was on birth control for years but due to increase BP she stopped.  -She did have endometrial ablation for menorrhagia, but was not successful.  -She also has sporadic vaginal bleeding with any pelvic floor pushing. Her Gyn work up was benign.  -She plans to have hysterectomy in the future.   3. Chronic back pain from ruptured disc, Arthritis  -She did have back surgery previously and pain is now manageable with Cymbalta and tramadol, well controlled.    PLAN:  -Proceed with second right breast biopsy tomorrow  -Mammogram in 03/2021  -Breast MRI in 09/2021  -Lab and F/u in 1 year.    No problem-specific Assessment & Plan notes found for this encounter.   Orders Placed This Encounter  Procedures  . MM Digital Screening    Standing Status:   Future    Standing Expiration Date:    12/02/2021    Order Specific Question:   Reason for Exam (SYMPTOM  OR DIAGNOSIS REQUIRED)    Answer:   screening    Order Specific Question:   Is the patient pregnant?    Answer:   No    Order Specific Question:   Preferred imaging location?    Answer:   [facility]  . MR BREAST BILATERAL W WO CONTRAST INC CAD    Standing Status:   Future    Standing Expiration Date:   12/03/2021    Order Specific Question:   If indicated for the ordered procedure, I authorize the administration of contrast media per Radiology protocol    Answer:   Yes    Order Specific Question:   What is the patient's sedation requirement?    Answer:   No Sedation    Order Specific Question:   Does the patient have a pacemaker or implanted devices?    Answer:   No    Order Specific Question:   Radiology Contrast Protocol - do NOT remove file path    Answer:   [email or web address]    Order Specific Question:   Preferred imaging location?    Answer:   [facility] (table limit-550lbs)   All questions were answered. The patient knows to call the clinic with any problems, questions or concerns. No barriers to learning was detected. The total time spent in the appointment was 25 minutes.     Truitt Merle, MD 12/02/2020   I, Joslyn Devon, am acting as scribe for Truitt Merle, MD.   I have reviewed the above documentation for accuracy and completeness, and I agree with the above.

## 2020-12-02 ENCOUNTER — Other Ambulatory Visit: Payer: Self-pay

## 2020-12-02 ENCOUNTER — Inpatient Hospital Stay: Payer: 59 | Attending: Hematology | Admitting: Hematology

## 2020-12-02 VITALS — BP 128/86 | HR 75 | Temp 97.8°F | Resp 17 | Ht 65.0 in | Wt 221.4 lb

## 2020-12-02 DIAGNOSIS — Z1231 Encounter for screening mammogram for malignant neoplasm of breast: Secondary | ICD-10-CM

## 2020-12-02 DIAGNOSIS — R011 Cardiac murmur, unspecified: Secondary | ICD-10-CM | POA: Diagnosis not present

## 2020-12-02 DIAGNOSIS — Z79899 Other long term (current) drug therapy: Secondary | ICD-10-CM | POA: Insufficient documentation

## 2020-12-02 DIAGNOSIS — N920 Excessive and frequent menstruation with regular cycle: Secondary | ICD-10-CM | POA: Diagnosis not present

## 2020-12-02 DIAGNOSIS — Z1501 Genetic susceptibility to malignant neoplasm of breast: Secondary | ICD-10-CM | POA: Insufficient documentation

## 2020-12-02 DIAGNOSIS — R51 Headache with orthostatic component, not elsewhere classified: Secondary | ICD-10-CM | POA: Insufficient documentation

## 2020-12-02 DIAGNOSIS — M549 Dorsalgia, unspecified: Secondary | ICD-10-CM | POA: Insufficient documentation

## 2020-12-02 DIAGNOSIS — M1990 Unspecified osteoarthritis, unspecified site: Secondary | ICD-10-CM | POA: Insufficient documentation

## 2020-12-02 DIAGNOSIS — Z1239 Encounter for other screening for malignant neoplasm of breast: Secondary | ICD-10-CM | POA: Diagnosis not present

## 2020-12-02 DIAGNOSIS — Z803 Family history of malignant neoplasm of breast: Secondary | ICD-10-CM | POA: Insufficient documentation

## 2020-12-02 DIAGNOSIS — G4700 Insomnia, unspecified: Secondary | ICD-10-CM | POA: Insufficient documentation

## 2020-12-02 DIAGNOSIS — R55 Syncope and collapse: Secondary | ICD-10-CM | POA: Diagnosis not present

## 2020-12-02 DIAGNOSIS — G8929 Other chronic pain: Secondary | ICD-10-CM | POA: Insufficient documentation

## 2020-12-03 ENCOUNTER — Encounter: Payer: Self-pay | Admitting: Hematology

## 2020-12-03 ENCOUNTER — Ambulatory Visit
Admission: RE | Admit: 2020-12-03 | Discharge: 2020-12-03 | Disposition: A | Discharge status: Home / Self Care | Payer: 59 | Source: Ambulatory Visit | Attending: Obstetrics and Gynecology | Admitting: Obstetrics and Gynecology

## 2020-12-03 ENCOUNTER — Other Ambulatory Visit (HOSPITAL_COMMUNITY): Payer: Self-pay | Admitting: Diagnostic Radiology

## 2020-12-03 DIAGNOSIS — Z803 Family history of malignant neoplasm of breast: Secondary | ICD-10-CM

## 2020-12-03 DIAGNOSIS — R928 Other abnormal and inconclusive findings on diagnostic imaging of breast: Secondary | ICD-10-CM

## 2020-12-03 DIAGNOSIS — Z9189 Other specified personal risk factors, not elsewhere classified: Secondary | ICD-10-CM

## 2020-12-03 MED ORDER — GADOBUTROL 1 MMOL/ML IV SOLN
10.0000 mL | Freq: Once | INTRAVENOUS | Status: AC | PRN
  Administered 2020-12-03: 10 mL via INTRAVENOUS

## 2020-12-04 ENCOUNTER — Other Ambulatory Visit: Payer: Self-pay | Admitting: Obstetrics and Gynecology

## 2020-12-04 DIAGNOSIS — R9389 Abnormal findings on diagnostic imaging of other specified body structures: Secondary | ICD-10-CM

## 2020-12-09 ENCOUNTER — Other Ambulatory Visit: Payer: Self-pay

## 2020-12-09 ENCOUNTER — Ambulatory Visit
Admission: RE | Admit: 2020-12-09 | Discharge: 2020-12-09 | Disposition: A | Discharge status: Home / Self Care | Payer: 59 | Source: Ambulatory Visit | Attending: Obstetrics and Gynecology | Admitting: Obstetrics and Gynecology

## 2020-12-09 ENCOUNTER — Other Ambulatory Visit: Payer: Self-pay | Admitting: Diagnostic Radiology

## 2020-12-09 DIAGNOSIS — R9389 Abnormal findings on diagnostic imaging of other specified body structures: Secondary | ICD-10-CM

## 2020-12-09 MED ORDER — GADOBUTROL 1 MMOL/ML IV SOLN
10.0000 mL | Freq: Once | INTRAVENOUS | Status: AC | PRN
  Administered 2020-12-09: 10 mL via INTRAVENOUS

## 2020-12-19 ENCOUNTER — Telehealth: Payer: Self-pay

## 2020-12-19 NOTE — Telephone Encounter (Signed)
Stacy Hays called stating she had a biopsy that shows precancerous cells.  She has a consultation with a surgeon on 01/06/2021.  She wants to know if she should have BRACa testing before the consultation.

## 2020-12-30 ENCOUNTER — Telehealth: Payer: Self-pay | Admitting: Hematology

## 2020-12-30 NOTE — Telephone Encounter (Signed)
I called patient back regarding her question about genetic testing.  Her recent 2 breast biopsy showed ductal papilloma, no evidence of DCIS or invasive cancer.  I reviewed with genetic counselor Stacy Hays, who saw her last year. Her mother had genetic testing which came back negative.  Stacy Hays feels her chance of BRCA1 and BRCA2 positive is pretty low, and does not feels strongly that she needs to be tested, unless she has confirmed breast cancer. But it's reasonable to test her if she wishes. I reviewed the above with pt and she decided to hold on genetic testing for now. She knows to call us if she changes her mind, or if her surgical path shows cancer. She is scheduled to see breast Surgeon Dr. Georgette Dover next week. I will copy him.   Truitt Merle  12/30/2020

## 2021-01-01 ENCOUNTER — Telehealth: Payer: Self-pay | Admitting: Hematology

## 2021-01-01 ENCOUNTER — Other Ambulatory Visit: Payer: Self-pay | Admitting: Genetic Counselor

## 2021-01-01 DIAGNOSIS — Z803 Family history of malignant neoplasm of breast: Secondary | ICD-10-CM

## 2021-01-01 NOTE — Telephone Encounter (Signed)
Scheduled appt per 1/4 sch msg - left message for pt with apt date and time

## 2021-01-02 ENCOUNTER — Other Ambulatory Visit: Payer: Self-pay

## 2021-01-02 ENCOUNTER — Inpatient Hospital Stay: Payer: 59 | Attending: Hematology

## 2021-01-02 DIAGNOSIS — Z803 Family history of malignant neoplasm of breast: Secondary | ICD-10-CM

## 2021-01-02 LAB — GENETIC SCREENING ORDER

## 2021-01-06 ENCOUNTER — Ambulatory Visit: Payer: Self-pay | Admitting: Surgery

## 2021-01-06 DIAGNOSIS — D241 Benign neoplasm of right breast: Secondary | ICD-10-CM

## 2021-01-06 NOTE — H&P (Signed)
History of Present Illness Stacy Hays. [name] MD; 01/06/2021 11:34 AM) The patient is a 50 year old female who presents with a breast mass. Referred by Dr. Truitt Merle for right breast papillomas  PCP -Dr. Alysia Penna GYN - Dr. Josefa Half  This is a 50 year old female with a family history of breast cancer in her mother (premenopausal at age 55), paternal grandmother, and paternal aunt who was seen for her high risk for breast cancer. Her mother underwent genetic testing and is negative for BRCA1 and 2. She underwent screening MRI in April of this year. This showed a 12 mm linear focus of enhancement in the lower central right breast as well as a 5 mm area in the superior right breast. She underwent biopsy of the 5 mm area in the upper right breast which revealed the diagnosis of fibrocystic changes with usual ductal hyperplasia. The 1.2 cm area of enhancement was not visualized at the time of biopsy. Subsequently, she underwent a six-month follow-up MRI which showed a new 0.5 cm area in the upper central right breast as well as the 1.2 cm area in the lower central right breast. The 1.2 cm area was subsequently biopsied under MRI guidance and revealed the diagnosis of ductal papilloma with fibrocystic changes. A dumbbell-shaped marker was placed at this area. Subsequently, she underwent another MRI guided biopsy of the 0.5 cm mass in the upper inner quadrant of the right breast. A Q-shaped clip was placed here. This biopsy revealed diagnosis of intraductal papilloma. The patient has been quite nervous about these and comes in today for her first consultation. She states that she did have genetic testing sent last week but is still waiting on the results.   CLINICAL DATA: Status post MRI guided biopsy of a 0.5 cm mass within the upper inner quadrant the RIGHT breast  EXAM: DIAGNOSTIC RIGHT MAMMOGRAM POST MRI BIOPSY  COMPARISON: Previous exam(s).  FINDINGS: Mammographic images were  obtained following MRI guided biopsy of 0.5 cm mass within the upper central RIGHT breast (upper inner quadrant. The Q shaped biopsy marking clip is in expected position at the site of biopsy.  IMPRESSION: 1. Appropriate positioning of the Q shaped biopsy marking clip at the site of biopsy in the upper inner quadrant of the RIGHT breast corresponding to the site of the targeted 0.5 cm mass on MRI guided biopsy today. 2. The cylinder shaped clip placed at the time of an earlier benign breast biopsy was removed during today's biopsy. 3. Dumbbell-shaped clip within the slightly lower outer RIGHT breast corresponds to recent biopsy-proven papilloma within the RIGHT breast.  Final Assessment: Post Procedure Mammograms for Marker Placement   Electronically Signed By: [name] M.D. On: 12/09/2020 10:37   Problem List/Past Medical [name], MD; 01/06/2021 11:34 AM) Stacy Hays PAPILLOMA OF RIGHT BREAST (D24.1)  Past Surgical History ([name], CMA; 01/06/2021 10:02 AM) Breast Biopsy Bilateral. multiple Oral Surgery Spinal Surgery - Lower Back  Diagnostic Studies History ([name], [name]; 01/06/2021 10:02 AM) Colonoscopy never Mammogram within last year Pap Smear 1-5 years ago  Allergies ([name], CMA; 01/06/2021 10:03 AM) Phenergan *ANTIHISTAMINES* Allergies Reconciled  Medication History ([name], CMA; 01/06/2021 10:04 AM) traMADol HCl (50MG Tablet, Oral) Active. Zolpidem Tartrate (10MG Tablet, Oral) Active. Vitamin D (Ergocalciferol) (1.25 MG(50000 UT) Capsule, Oral) Active. Dicyclomine HCl (20MG Tablet, Oral) Active. Multivitamin (Oral) Active. Glucosamine Chondroitin Complx (Oral) Active. Medications Reconciled  Social History [name], CMA; 01/06/2021 10:02 AM) Alcohol use Occasional alcohol use. Caffeine  use Coffee, Tea. No drug use Tobacco use Never smoker.  Family History [name], [name]; 01/06/2021 10:02  AM) Anesthetic complications Mother. Arthritis Mother. Breast Cancer Mother. Cerebrovascular Accident Mother. Diabetes Mellitus Mother. Heart Disease Mother. Heart disease in female family member before age 1 Heart disease in female family member before age 59 Hypertension Father, Mother. Kidney Disease Mother.  Pregnancy / Birth History [name], [name]; 01/06/2021 10:02 AM) Age at menarche 41 years. Contraceptive History Oral contraceptives. Gravida 3 Irregular periods Length (months) of breastfeeding 12-24 Maternal age 54-20 Para 3  Other Problems Stacy Hays. [name], MD; 01/06/2021 11:34 AM) Anxiety Disorder Back Pain Bladder Problems Depression Lump In Breast Migraine Headache     Review of Systems ([name] CMA; 01/06/2021 10:02 AM) General Not Present- Appetite Loss, Chills, Fatigue, Fever, Night Sweats, Weight Gain and Weight Loss. Skin Not Present- Change in Wart/Mole, Dryness, Hives, Jaundice, New Lesions, Non-Healing Wounds, Rash and Ulcer. HEENT Not Present- Earache, Hearing Loss, Hoarseness, Nose Bleed, Oral Ulcers, Ringing in the Ears, Seasonal Allergies, Sinus Pain, Sore Throat, Visual Disturbances, Wears glasses/contact lenses and Yellow Eyes. Respiratory Not Present- Bloody sputum, Chronic Cough, Difficulty Breathing, Snoring and Wheezing. Breast Present- Breast Mass. Not Present- Breast Pain, Nipple Discharge and Skin Changes. Cardiovascular Not Present- Chest Pain, Difficulty Breathing Lying Down, Leg Cramps, Palpitations, Rapid Heart Rate, Shortness of Breath and Swelling of Extremities. Gastrointestinal Not Present- Abdominal Pain, Bloating, Bloody Stool, Change in Bowel Habits, Chronic diarrhea, Constipation, Difficulty Swallowing, Excessive gas, Gets full quickly at meals, Hemorrhoids, Indigestion, Nausea, Rectal Pain and Vomiting. Female Genitourinary Not Present- Frequency, Nocturia, Painful Urination, Pelvic Pain and  Urgency. Musculoskeletal Not Present- Back Pain, Joint Pain, Joint Stiffness, Muscle Pain, Muscle Weakness and Swelling of Extremities. Neurological Not Present- Decreased Memory, Fainting, Headaches, Numbness, Seizures, Tingling, Tremor, Trouble walking and Weakness. Psychiatric Present- Anxiety. Not Present- Bipolar, Change in Sleep Pattern, Depression, Fearful and Frequent crying. Endocrine Not Present- Cold Intolerance, Excessive Hunger, Hair Changes, Heat Intolerance, Hot flashes and New Diabetes. Hematology Not Present- Blood Thinners, Easy Bruising, Excessive bleeding, Gland problems, HIV and Persistent Infections.  Vitals ([name] CMA; 01/06/2021 10:04 AM) 01/06/2021 10:04 AM Weight: 223.25 lb Height: 67in Body Surface Area: 2.12 m Body Mass Index: 34.97 kg/m  Temp.: 98.82F  Pulse: 100 (Regular)  BP: 126/72(Sitting, Left Arm, Standard)        Physical Exam Rodman Key K. Chantalle Defilippo MD; 01/06/2021 11:35 AM)  The physical exam findings are as follows: Note:Constitutional: WDWN in NAD, conversant, no obvious deformities; resting comfortably Eyes: Pupils equal, round; sclera anicteric; moist conjunctiva; no lid lag HENT: Oral mucosa moist; good dentition Neck: No masses palpated, trachea midline; no thyromegaly Lungs: CTA bilaterally; normal respiratory effort Breasts: Symmetric, no nipple retraction or discharge, no axillary lymphadenopathy on either side, no palpable masses in either breast. CV: Regular rate and rhythm; no murmurs; extremities well-perfused with no edema Abd: +bowel sounds, soft, non-tender, no palpable organomegaly; no palpable hernias Musc: Normal gait; no apparent clubbing or cyanosis in extremities Lymphatic: No palpable cervical or axillary lymphadenopathy Skin: Warm, dry; no sign of jaundice Psychiatric - alert and oriented x 4; calm mood and affect    Assessment & Plan Rodman Key K. Skarlette Lattner MD; 01/06/2021 10:40 AM)  Stacy Hays PAPILLOMA OF  RIGHT BREAST (D24.1) Impression: x 2  Current Plans Schedule for Surgery - Right radioactive seed localized lumpectomy x 2. The surgical procedure has been discussed with the patient. Potential risks, benefits, alternative treatments, and expected outcomes have been explained. All of  the patient's questions at this time have been answered. The likelihood of reaching the patient's treatment goal is good. The patient understand the proposed surgical procedure and wishes to proceed.  Stacy Hays. Georgette Dover, MD, [facility]  General/ Trauma Surgery   01/06/2021 11:35 AM

## 2021-01-08 ENCOUNTER — Other Ambulatory Visit: Payer: Self-pay | Admitting: Surgery

## 2021-01-08 DIAGNOSIS — D241 Benign neoplasm of right breast: Secondary | ICD-10-CM

## 2021-01-16 ENCOUNTER — Encounter: Payer: Self-pay | Admitting: Genetic Counselor

## 2021-01-16 ENCOUNTER — Ambulatory Visit: Payer: Self-pay | Admitting: Genetic Counselor

## 2021-01-16 ENCOUNTER — Telehealth: Payer: Self-pay | Admitting: Genetic Counselor

## 2021-01-16 DIAGNOSIS — Z1379 Encounter for other screening for genetic and chromosomal anomalies: Secondary | ICD-10-CM | POA: Insufficient documentation

## 2021-01-16 NOTE — Progress Notes (Signed)
HPI:  Ms. Kannan was previously seen in the [facility] due to a family history of breast cancer and concerns regarding a hereditary predisposition to cancer. Please refer to our prior cancer genetics clinic note for more information regarding our discussion, assessment and recommendations, at the time. Ms. Fabio recent genetic test results were disclosed to her, as were recommendations warranted by these results. These results and recommendations are discussed in more detail below.  CANCER HISTORY:  Oncology History   No history exists.    FAMILY HISTORY:  We obtained a detailed, 4-generation family history.  Significant diagnoses are listed below: Family History  Problem Relation Age of Onset  . Diabetes Mother 25  . Hyperlipidemia Mother 52  . Hypertension Mother   . Breast cancer Mother 9  . Heart disease Mother   . Kidney disease Other   . Prostate cancer Other   . Sudden death Other   . Coronary artery disease Other   . Heart disease Maternal Grandfather 42  . Birth defects Paternal Grandmother   . Breast cancer Paternal Grandmother   . Heart disease Paternal Grandfather   . Diabetes Paternal Grandfather   . Stroke Paternal Grandfather   . Breast cancer Maternal Grandmother 41  . Cervical cancer Maternal Aunt   . Mental retardation Maternal Aunt   . Cerebral palsy Maternal Aunt   . Breast cancer Cousin        dx in her 52s, mat first cousin  . Breast cancer Cousin        mat first cousin    The patient has a son and two daughters who are cancer free.  She has one brother and two paternal half sisters who are call cancer free.  Both parents are living.  The patient 's mother was diagnosed with breast cancer at age 65.  She has two sisters, one who had cervical cancer.  Her parents are deceased.  The mother had breast cancer.  She had several brothers, two who had daughters with breast cancer.  The patient's father is living, but she is not  in contact with him.  He has two sisters who are cancer free.  His parents are deceased.  His mother had breast cancer in her 24's.  Ms. Heinzelman is unaware of previous family history of genetic testing for hereditary cancer risks. Patient's maternal ancestors are of Caucasian descent, and paternal ancestors are of Caucasian descent. There is no reported Ashkenazi Jewish ancestry. There is no known consanguinity.    GENETIC TEST RESULTS: Genetic testing reported out on January 16, 2021 through the CancerNext-Expanded+RNAinsight cancer panel found no pathogenic mutations. The CancerNext-Expanded gene panel offered by [facility] and includes sequencing and rearrangement analysis for the following 77 genes: AIP, ALK, APC*, ATM*, AXIN2, BAP1, BARD1, BLM, BMPR1A, BRCA1*, BRCA2*, BRIP1*, CDC73, CDH1*, CDK4, CDKN1B, CDKN2A, CHEK2*, CTNNA1, DICER1, FANCC, FH, FLCN, GALNT12, KIF1B, LZTR1, MAX, MEN1, MET, MLH1*, MSH2*, MSH3, MSH6*, MUTYH*, NBN, NF1*, NF2, NTHL1, PALB2*, PHOX2B, PMS2*, POT1, PRKAR1A, PTCH1, PTEN*, RAD51C*, RAD51D*, RB1, RECQL, RET, SDHA, SDHAF2, SDHB, SDHC, SDHD, SMAD4, SMARCA4, SMARCB1, SMARCE1, STK11, SUFU, TMEM127, TP53*, TSC1, TSC2, VHL and XRCC2 (sequencing and deletion/duplication); EGFR, EGLN1, HOXB13, KIT, MITF, PDGFRA, POLD1, and POLE (sequencing only); EPCAM and GREM1 (deletion/duplication only). DNA and RNA analyses performed for * genes. . The test report has been scanned into EPIC and is located under the Molecular Pathology section of the Results Review tab.  A portion of the result report is included below  for reference.     We discussed with Ms. Apsey that because current genetic testing is not perfect, it is possible there may be a gene mutation in one of these genes that current testing cannot detect, but that chance is small.  We also discussed, that there could be another gene that has not yet been discovered, or that we have not yet tested, that is responsible for the  cancer diagnoses in the family. It is also possible there is a hereditary cause for the cancer in the family that Ms. Tranchina did not inherit and therefore was not identified in her testing.  Therefore, it is important to remain in touch with cancer genetics in the future so that we can continue to offer Ms. Bringle the most up to date genetic testing.   ADDITIONAL GENETIC TESTING: We discussed with Ms. Siglin that her genetic testing was fairly extensive.  If there are genes identified to increase cancer risk that can be analyzed in the future, we would be happy to discuss and coordinate this testing at that time.    CANCER SCREENING RECOMMENDATIONS: Ms. Covel test result is considered negative (normal).  This means that we have not identified a hereditary cause for her family history of breast cancer at this time. Most cancers happen by chance and this negative test suggests that her cancer may fall into this category.    While reassuring, this does not definitively rule out a hereditary predisposition to cancer. It is still possible that there could be genetic mutations that are undetectable by current technology. There could be genetic mutations in genes that have not been tested or identified to increase cancer risk.  Therefore, it is recommended she continue to follow the cancer management and screening guidelines provided by her oncology and primary healthcare provider.   An individual's cancer risk and medical management are not determined by genetic test results alone. Overall cancer risk assessment incorporates additional factors, including personal medical history, family history, and any available genetic information that may result in a personalized plan for cancer prevention and surveillance  RECOMMENDATIONS FOR FAMILY MEMBERS:  Individuals in this family might be at some increased risk of developing cancer, over the general population risk, simply due to the family history of cancer.   We recommended women in this family have a yearly mammogram beginning at age 58, or 4 years younger than the earliest onset of cancer, an annual clinical breast exam, and perform monthly breast self-exams. Women in this family should also have a gynecological exam as recommended by their primary provider. All family members should be referred for colonoscopy starting at age 38.  FOLLOW-UP: Lastly, we discussed with Ms. Rossi that cancer genetics is a rapidly advancing field and it is possible that new genetic tests will be appropriate for her and/or her family members in the future. We encouraged her to remain in contact with cancer genetics on an annual basis so we can update her personal and family histories and let her know of advances in cancer genetics that may benefit this family.   Our contact number was provided. Ms. Lentsch questions were answered to her satisfaction, and she knows she is welcome to call us at anytime with additional questions or concerns.   Roma Kayser, [place], [facility] Licensed, Certified Genetic Counselor [email or web address]

## 2021-01-16 NOTE — Telephone Encounter (Signed)
Revealed negative genetic testing.  Discussed that we do not know why there is cancer in the family. It could be due to a different gene that we are not testing, or maybe our current technology may not be able to pick something up.  It will be important for her to keep in contact with genetics to keep up with whether additional testing may be needed.  

## 2021-02-05 ENCOUNTER — Other Ambulatory Visit: Payer: Self-pay | Admitting: Family Medicine

## 2021-02-07 ENCOUNTER — Telehealth: Payer: Self-pay | Admitting: Family Medicine

## 2021-02-07 ENCOUNTER — Other Ambulatory Visit: Payer: Self-pay

## 2021-02-07 MED ORDER — ZOLPIDEM TARTRATE 10 MG PO TABS: 10.0000 mg | ORAL_TABLET | Freq: Every day | ORAL | 1 refills | Status: DC

## 2021-02-07 NOTE — Telephone Encounter (Signed)
Pr call and stated she have a appt on wed and need something pill call in because she out of her zolpidem (AMBIEN) 10 MG tablet sent to  [facility], [place] - [place] AT [address] Phone:  [phone]  Fax:  [phone]

## 2021-02-07 NOTE — Telephone Encounter (Signed)
Med sent to pharmacy.

## 2021-02-10 ENCOUNTER — Telehealth: Payer: Self-pay | Admitting: Family Medicine

## 2021-02-10 NOTE — Telephone Encounter (Signed)
No RX for Ambien at the pharmacy.  LOV 04/08/20 FOV   02/12/21  Please advise.  Thanks. Dm/cma

## 2021-02-10 NOTE — Telephone Encounter (Signed)
Pt call and stated the pharmacy stated they didn't get the refill on Ambien and want it resent to  [facility], [place] - [place] AT [place] Phone:  [phone]  Fax:  [phone]    Pt stated she want a call when it is sent.

## 2021-02-11 MED ORDER — ZOLPIDEM TARTRATE 10 MG PO TABS: 10.0000 mg | ORAL_TABLET | Freq: Every day | ORAL | 5 refills | Status: DC

## 2021-02-11 NOTE — Telephone Encounter (Signed)
Done

## 2021-02-11 NOTE — Telephone Encounter (Signed)
Pharmacy called this morning and they received the rx and is getting it ready for her. Dm/cma

## 2021-02-12 ENCOUNTER — Telehealth (INDEPENDENT_AMBULATORY_CARE_PROVIDER_SITE_OTHER): Payer: 59 | Admitting: Family Medicine

## 2021-02-12 ENCOUNTER — Encounter: Payer: Self-pay | Admitting: Family Medicine

## 2021-02-12 VITALS — Ht 65.0 in | Wt 200.0 lb

## 2021-02-12 DIAGNOSIS — G4700 Insomnia, unspecified: Secondary | ICD-10-CM

## 2021-02-12 DIAGNOSIS — G4489 Other headache syndrome: Secondary | ICD-10-CM | POA: Diagnosis not present

## 2021-02-12 DIAGNOSIS — F418 Other specified anxiety disorders: Secondary | ICD-10-CM

## 2021-02-12 MED ORDER — TRAMADOL HCL 50 MG PO TABS: ORAL_TABLET | ORAL | 5 refills | Status: DC

## 2021-02-12 MED ORDER — DULOXETINE HCL 30 MG PO CPEP: 30.0000 mg | ORAL_CAPSULE | Freq: Every day | ORAL | 3 refills | Status: DC

## 2021-02-12 MED ORDER — ZOLPIDEM TARTRATE 10 MG PO TABS: 10.0000 mg | ORAL_TABLET | Freq: Every day | ORAL | 1 refills | Status: DC

## 2021-02-12 NOTE — Progress Notes (Signed)
Subjective:    Patient ID: Stacy Hays, female    DOB: 16-Nov-1971, 50 y.o.   MRN: [id number]  HPI Virtual Visit via Video Note  I connected with the patient on 02/12/21 at  1:00 PM EST by a video enabled telemedicine application and verified that I am speaking with the correct person using two identifiers.  Location patient: home Location provider:work or home office Persons participating in the virtual visit: patient, provider  I discussed the limitations of evaluation and management by telemedicine and the availability of in person appointments. The patient expressed understanding and agreed to proceed.   HPI: Here to follow up on depression with anxiety, migraines, and insomnia. She is doing well. She needs refills on medications.    ROS: See pertinent positives and negatives per HPI.  Past Medical History:  Diagnosis Date  . Arthritis   . Depression   . Dysfunctional uterine bleeding   . Family history of breast cancer   . Headache(784.0)   . Heart murmur   . Herniated disc   . Insomnia   . Migraine without aura   . Overactive bladder   . Plantar fasciitis     Past Surgical History:  Procedure Laterality Date  . ABDOMINOPLASTY  2006  . BREAST BIOPSY Right 2013  . CYSTOCELE REPAIR     with endometrial ablation  . ENDOMETRIAL ABLATION  2010  . LEEP  1993   had 2 in the same year   . LUMBAR DISC SURGERY  2007    Family History  Problem Relation Age of Onset  . Diabetes Mother 59  . Hyperlipidemia Mother 55  . Hypertension Mother   . Breast cancer Mother 33  . Heart disease Mother   . Kidney disease Other   . Prostate cancer Other   . Sudden death Other   . Coronary artery disease Other   . Heart disease Maternal Grandfather 42  . Birth defects Paternal Grandmother   . Breast cancer Paternal Grandmother   . Heart disease Paternal Grandfather   . Diabetes Paternal Grandfather   . Stroke Paternal Grandfather   . Breast cancer Maternal  Grandmother 46  . Cervical cancer Maternal Aunt   . Mental retardation Maternal Aunt   . Cerebral palsy Maternal Aunt   . Breast cancer Cousin        dx in her 61s, mat first cousin  . Breast cancer Cousin        mat first cousin     Current Outpatient Medications:  Marland Kitchen  Misc Natural Products (GLUCOS-CHONDROIT-MSM COMPLEX PO), Take 1 tablet by mouth daily., Disp: , Rfl:  .  Multiple Vitamin (MULTIVITAMIN) tablet, Take 1 tablet by mouth daily., Disp: , Rfl:  .  ondansetron (ZOFRAN-ODT) 8 MG disintegrating tablet, DISSOLVE 1 TABLET ON THE  TONGUE EVERY 8 HOURS AS  NEEDED FOR NAUSEA AND  VOMITING, Disp: 120 tablet, Rfl: 0 .  SUMAtriptan (IMITREX) 100 MG tablet, TAKE 1 TABLET BY MOUTH AS NEEDED FOR HEADACHE, Disp: 10 tablet, Rfl: 11 .  valACYclovir (VALTREX) 500 MG tablet, Take 1 tablet (500 mg total) by mouth 2 (two) times daily as needed (fever blisters)., Disp: 60 tablet, Rfl: 5 .  Vitamin D, Ergocalciferol, (DRISDOL) 1.25 MG (50000 UNIT) CAPS capsule, Take 1 capsule (50,000 Units total) by mouth every 7 (seven) days., Disp: 12 capsule, Rfl: 0 .  DULoxetine (CYMBALTA) 30 MG capsule, Take 1 capsule (30 mg total) by mouth daily., Disp: 90 capsule, Rfl: 3 .  traMADol (ULTRAM) 50 MG tablet, TAKE 1 TABLET(50 MG) BY MOUTH EVERY 6 HOURS, Disp: 120 tablet, Rfl: 5 .  zolpidem (AMBIEN) 10 MG tablet, Take 1 tablet (10 mg total) by mouth at bedtime., Disp: 90 tablet, Rfl: 1  EXAM:  VITALS per patient if applicable:  GENERAL: alert, oriented, appears well and in no acute distress  HEENT: atraumatic, conjunttiva clear, no obvious abnormalities on inspection of external nose and ears  NECK: normal movements of the head and neck  LUNGS: on inspection no signs of respiratory distress, breathing rate appears normal, no obvious gross SOB, gasping or wheezing  CV: no obvious cyanosis  MS: moves all visible extremities without noticeable abnormality  PSYCH/NEURO: pleasant and cooperative, no obvious  depression or anxiety, speech and thought processing grossly intact  ASSESSMENT AND PLAN: Her depression with anxiety, insomnia, and headaches are stable. We reifilled her meds.  Alysia Penna, MD  Discussed the following assessment and plan:  No diagnosis found.     I discussed the assessment and treatment plan with the patient. The patient was provided an opportunity to ask questions and all were answered. The patient agreed with the plan and demonstrated an understanding of the instructions.   The patient was advised to call back or seek an in-person evaluation if the symptoms worsen or if the condition fails to improve as anticipated.     Review of Systems     Objective:   Physical Exam        Assessment & Plan:

## 2021-02-13 ENCOUNTER — Other Ambulatory Visit: Payer: Self-pay | Admitting: Family Medicine

## 2021-03-08 ENCOUNTER — Other Ambulatory Visit (HOSPITAL_COMMUNITY): Payer: 59

## 2021-03-09 ENCOUNTER — Other Ambulatory Visit: Payer: Self-pay | Admitting: Family Medicine

## 2021-03-12 ENCOUNTER — Encounter (HOSPITAL_BASED_OUTPATIENT_CLINIC_OR_DEPARTMENT_OTHER): Payer: Self-pay

## 2021-03-12 ENCOUNTER — Ambulatory Visit (HOSPITAL_BASED_OUTPATIENT_CLINIC_OR_DEPARTMENT_OTHER): Admit: 2021-03-12 | Payer: 59 | Admitting: Surgery

## 2021-03-12 SURGERY — BREAST LUMPECTOMY WITH RADIOACTIVE SEED LOCALIZATION
Anesthesia: General | Site: Breast | Laterality: Right

## 2021-05-10 ENCOUNTER — Other Ambulatory Visit: Payer: Self-pay | Admitting: Family Medicine

## 2021-06-09 ENCOUNTER — Telehealth: Payer: Self-pay | Admitting: Family Medicine

## 2021-06-09 NOTE — Telephone Encounter (Signed)
Patient is calling and is requesting a refill for Xanax (currently not on med list) to be sent to   [facility], [place] - [place] AT [place] [facility]  [address], [place] [address]  Phone:  [phone]  Fax:  [phone]  CB is [phone]

## 2021-06-09 NOTE — Telephone Encounter (Signed)
Last office visit- 02/12/21 Last refill- 08/25/11  No future appointment scheduled

## 2021-06-11 MED ORDER — ALPRAZOLAM 0.25 MG PO TABS: 0.2500 mg | ORAL_TABLET | Freq: Three times a day (TID) | ORAL | 5 refills | Status: DC | PRN

## 2021-06-11 NOTE — Telephone Encounter (Signed)
Pt was notified through [place]

## 2021-06-11 NOTE — Telephone Encounter (Signed)
Done

## 2021-06-27 DIAGNOSIS — U071 COVID-19: Secondary | ICD-10-CM

## 2021-06-27 HISTORY — DX: COVID-19: U07.1

## 2021-07-03 ENCOUNTER — Telehealth (INDEPENDENT_AMBULATORY_CARE_PROVIDER_SITE_OTHER): Payer: 59 | Admitting: Internal Medicine

## 2021-07-03 ENCOUNTER — Encounter: Payer: Self-pay | Admitting: Internal Medicine

## 2021-07-03 VITALS — Temp 98.0°F | Ht 65.0 in

## 2021-07-03 DIAGNOSIS — U071 COVID-19: Secondary | ICD-10-CM

## 2021-07-03 MED ORDER — BENZONATATE 100 MG PO CAPS: 100.0000 mg | ORAL_CAPSULE | Freq: Three times a day (TID) | ORAL | 0 refills | Status: DC | PRN

## 2021-07-03 NOTE — Progress Notes (Signed)
Virtual Visit via Video Note  I connected withNAME@ on 07/03/21 at 11:00 AM EDT by a video enabled telemedicine application and verified that I am speaking with the correct person using two identifiers. Location patient: home Location provider:home office Persons participating in the virtual visit: patient, provider  [place] national recommendations  regarding COVID 19 pandemic   video visit is advised over in office visit for this patient.  Patient aware  of the limitations of evaluation and management by telemedicine and  availability of in person appointments. and agreed to proceed.   HPI: Stacy Hays presents for video visit PCP appointment NA. Began having symptoms of fatigue cough upper respiratory congestion June 29 and initially had negative home COVID test.  This morning had a positive home COVID test.  She has been immunized x3 last booster September 2021 Has a cough that is at night but no shortness of breath fatigue and lost her voice although coming back. She works from home and is trying to work remotely. 1 daughter began having congestion and losing her voice June 20 to 25 COVID-negative next daughter June 28 the 29th with cough and congestion lost her voice again COVID-negative both of these rapid tests were done early in the illness they are now better. Has been just began getting some cough and congestion in the last 24 hours. S advised and information about the antivirals.    ROS: See pertinent positives and negatives per HPI.  Past Medical History:  Diagnosis Date   Arthritis    Depression    Dysfunctional uterine bleeding    Family history of breast cancer    Headache(784.0)    Heart murmur    Herniated disc    Insomnia    Migraine without aura    Overactive bladder    Plantar fasciitis     Past Surgical History:  Procedure Laterality Date   ABDOMINOPLASTY  2006   BREAST BIOPSY Right 2013   CYSTOCELE REPAIR     with endometrial ablation    ENDOMETRIAL ABLATION  2010   LEEP  1993   had 2 in the same year    [place]  2007    Family History  Problem Relation Age of Onset   Diabetes Mother 19   Hyperlipidemia Mother 29   Hypertension Mother    Breast cancer Mother 86   Heart disease Mother    Kidney disease Other    Prostate cancer Other    Sudden death Other    Coronary artery disease Other    Heart disease Maternal Grandfather 7   Birth defects Paternal Grandmother    Breast cancer Paternal Grandmother    Heart disease Paternal Grandfather    Diabetes Paternal Grandfather    Stroke Paternal Grandfather    Breast cancer Maternal Grandmother 65   Cervical cancer Maternal Aunt    Mental retardation Maternal Aunt    Cerebral palsy Maternal Aunt    Breast cancer Cousin        dx in her 60s, mat first cousin   Breast cancer Cousin        mat first cousin    Social History   Tobacco Use   Smoking status: Never   Smokeless tobacco: Never  Vaping Use   Vaping Use: Never used  Substance Use Topics   Alcohol use: Yes    Comment: 2   Drug use: No      Current Outpatient Medications:    ALPRAZolam (XANAX) 0.25 MG  tablet, Take 1 tablet (0.25 mg total) by mouth 3 (three) times daily as needed for anxiety., Disp: 90 tablet, Rfl: 5   benzonatate (TESSALON PERLES) 100 MG capsule, Take 1 capsule (100 mg total) by mouth 3 (three) times daily as needed for cough., Disp: 24 capsule, Rfl: 0   Desvenlafaxine Succinate ER 25 MG TB24, Take 1 tablet by mouth daily., Disp: , Rfl:    DULoxetine (CYMBALTA) 30 MG capsule, Take 1 capsule (30 mg total) by mouth daily., Disp: 90 capsule, Rfl: 3   hydrOXYzine (VISTARIL) 25 MG capsule, Take 25 mg by mouth daily as needed., Disp: , Rfl:    Misc Natural Products (GLUCOS-CHONDROIT-MSM COMPLEX PO), Take 1 tablet by mouth daily., Disp: , Rfl:    Multiple Vitamin (MULTIVITAMIN) tablet, Take 1 tablet by mouth daily., Disp: , Rfl:    ondansetron (ZOFRAN-ODT) 8 MG disintegrating  tablet, DISSOLVE 1 TABLET ON THE  TONGUE EVERY 8 HOURS AS  NEEDED FOR NAUSEA AND  VOMITING, Disp: 120 tablet, Rfl: 0   SUMAtriptan (IMITREX) 100 MG tablet, TAKE 1 TABLET BY MOUTH AS NEEDED FOR HEADACHE, Disp: 10 tablet, Rfl: 11   traMADol (ULTRAM) 50 MG tablet, TAKE 1 TABLET(50 MG) BY MOUTH EVERY 6 HOURS, Disp: 120 tablet, Rfl: 5   valACYclovir (VALTREX) 500 MG tablet, Take 1 tablet (500 mg total) by mouth 2 (two) times daily as needed (fever blisters)., Disp: 60 tablet, Rfl: 5   Vitamin D, Ergocalciferol, (DRISDOL) 1.25 MG (50000 UNIT) CAPS capsule, Take 1 capsule (50,000 Units total) by mouth every 7 (seven) days., Disp: 12 capsule, Rfl: 0   zolpidem (AMBIEN) 10 MG tablet, Take 1 tablet (10 mg total) by mouth at bedtime., Disp: 90 tablet, Rfl: 1  EXAM: BP Readings from Last 3 Encounters:  12/02/20 128/86  07/15/20 120/72  07/25/19 (!) 125/91    VITALS per patient if applicable:  GENERAL: alert, oriented, appears well and in no acute distress appears nontoxic slightly congested no serious coughing during visit no shortness of breath obvious color is good.  Looks tried   HEENT: atraumatic, conjunttiva clear, no obvious abnormalities on inspection of external nose and ears  NECK: normal movements of the head and neck  LUNGS: on inspection no signs of respiratory distress, breathing rate appears normal, no obvious gross SOB, gasping or wheezing  CV: no obvious cyanosis  MS: moves all visible extremities without noticeable abnormality  PSYCH/NEURO: pleasant and cooperative, no obvious depression or anxiety, speech and thought processing grossly intact   ASSESSMENT AND PLAN:  Discussed the following assessment and plan:    ICD-10-CM   1. COVID-19 virus infection  U07.1    in immunized  risk score 1  day 9     On day 8-9 of respiratory infection positive home COVID test.  And immunized person with low risk score of 1.  Would not advise antivirals minimal probable benefit versus  risk and later than 5 days of sx. Supportive care discussed cough medicine she can use Mucinex DM we will send in Tessalon Perles cannot take Phenergan and we are avoiding narcotics. 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 Husband should probably retest and contact his PCP for tomorrow. Counseled.   Expectant management and discussion of plan and treatment with opportunity to ask questions and all were answered. The patient agreed with the plan and demonstrated an understanding of the instructions. Record review  discussion plan follow-up 30 minutes.  AdVised to call back or seek an in-person evaluation if worsening  or having  further concerns . Return if symptoms worsen or fail to improve  sa expected.    Shanon Ace, MD

## 2021-07-17 ENCOUNTER — Other Ambulatory Visit: Payer: Self-pay

## 2021-07-18 ENCOUNTER — Encounter: Payer: Self-pay | Admitting: Family Medicine

## 2021-07-18 ENCOUNTER — Ambulatory Visit: Payer: 59 | Admitting: Family Medicine

## 2021-07-18 VITALS — BP 120/80 | HR 107 | Temp 98.5°F | Ht 65.0 in | Wt 197.8 lb

## 2021-07-18 DIAGNOSIS — N3000 Acute cystitis without hematuria: Secondary | ICD-10-CM | POA: Diagnosis not present

## 2021-07-18 DIAGNOSIS — R300 Dysuria: Secondary | ICD-10-CM

## 2021-07-18 DIAGNOSIS — G4700 Insomnia, unspecified: Secondary | ICD-10-CM | POA: Diagnosis not present

## 2021-07-18 DIAGNOSIS — F418 Other specified anxiety disorders: Secondary | ICD-10-CM | POA: Diagnosis not present

## 2021-07-18 LAB — CBC WITH DIFFERENTIAL/PLATELET
Basophils Absolute: 0.1 10*3/uL (ref 0.0–0.1)
Basophils Relative: 0.6 % (ref 0.0–3.0)
Eosinophils Absolute: 0.2 10*3/uL (ref 0.0–0.7)
Eosinophils Relative: 1.3 % (ref 0.0–5.0)
HCT: 43.6 % (ref 36.0–46.0)
Hemoglobin: 14.3 g/dL (ref 12.0–15.0)
Lymphocytes Relative: 10.8 % — ABNORMAL LOW (ref 12.0–46.0)
Lymphs Abs: 1.7 10*3/uL (ref 0.7–4.0)
MCHC: 32.7 g/dL (ref 30.0–36.0)
MCV: 87.9 fl (ref 78.0–100.0)
Monocytes Absolute: 0.9 10*3/uL (ref 0.1–1.0)
Monocytes Relative: 5.7 % (ref 3.0–12.0)
Neutro Abs: 12.5 10*3/uL — ABNORMAL HIGH (ref 1.4–7.7)
Neutrophils Relative %: 81.6 % — ABNORMAL HIGH (ref 43.0–77.0)
Platelets: 435 10*3/uL — ABNORMAL HIGH (ref 150.0–400.0)
RBC: 4.97 Mil/uL (ref 3.87–5.11)
RDW: 14.1 % (ref 11.5–15.5)
WBC: 15.3 10*3/uL — ABNORMAL HIGH (ref 4.0–10.5)

## 2021-07-18 LAB — POCT URINALYSIS DIPSTICK
Bilirubin, UA: POSITIVE
Blood, UA: POSITIVE
Glucose, UA: NEGATIVE
Ketones, UA: POSITIVE
Nitrite, UA: NEGATIVE
Protein, UA: POSITIVE — AB
Spec Grav, UA: <=1.005 — AB (ref 1.010–1.025)
Urobilinogen, UA: NEGATIVE E.U./dL — AB
pH, UA: 6 (ref 5.0–8.0)

## 2021-07-18 LAB — BASIC METABOLIC PANEL
BUN: 13 mg/dL (ref 6–23)
CO2: 30 mEq/L (ref 19–32)
Calcium: 10.2 mg/dL (ref 8.4–10.5)
Chloride: 102 mEq/L (ref 96–112)
Creatinine, Ser: 1.05 mg/dL (ref 0.40–1.20)
GFR: 62.01 mL/min (ref >60.00)
Glucose, Bld: 94 mg/dL (ref 70–99)
Potassium: 5.4 mEq/L — ABNORMAL HIGH (ref 3.5–5.1)
Sodium: 141 mEq/L (ref 135–145)

## 2021-07-18 LAB — T3, FREE: T3, Free: 3.2 pg/mL (ref 2.3–4.2)

## 2021-07-18 LAB — T4, FREE: Free T4: 0.82 ng/dL (ref 0.60–1.60)

## 2021-07-18 LAB — TSH: TSH: 2.41 u[IU]/mL (ref 0.35–5.50)

## 2021-07-18 MED ORDER — BUPROPION HCL ER (XL) 150 MG PO TB24: 150.0000 mg | ORAL_TABLET | Freq: Every day | ORAL | 2 refills | Status: DC

## 2021-07-18 MED ORDER — ZOLPIDEM TARTRATE ER 12.5 MG PO TBCR: 12.5000 mg | EXTENDED_RELEASE_TABLET | Freq: Every evening | ORAL | 2 refills | Status: DC | PRN

## 2021-07-18 MED ORDER — CIPROFLOXACIN HCL 500 MG PO TABS: 500.0000 mg | ORAL_TABLET | Freq: Two times a day (BID) | ORAL | 0 refills | Status: DC

## 2021-07-18 MED ORDER — DULOXETINE HCL 60 MG PO CPEP: 60.0000 mg | ORAL_CAPSULE | Freq: Every day | ORAL | 3 refills | Status: DC

## 2021-07-18 NOTE — Progress Notes (Signed)
yelloo

## 2021-07-18 NOTE — Progress Notes (Signed)
Subjective:    Patient ID: Stacy Hays, female    DOB: [birth year], 50 y.o.   MRN: [id number]  HPI Here to discuss a number of issues. First she says she has been struggling with depression and anxiety for the past few months, and she has a number of stressful things going on in her life right now. She hates the job that she started in January, and she quit this job a few days ago. Now she does not know what she will do for income. Also she and her husband are renting a house now while they build a new house, and this has been stressful for the entire family. She is also having trouble with sleep. She takes Zolpidem and it allows her to fall asleep quickly. However she wakes up in the middle of the night and cannot get back to sleep. Instead she lies in bed and worries about evrything. She has been taking Cymbalta 30 mg a day for pain control for some time. Last month, at the suggestion of her husband, she had a virtual visit with a psychiatrist who started her on Pristiq. She has been taking this in the evenings for about 4 weeks. This has made her anxiety even worse, and she feels moire depressed than ever. She has lost 20 lbs in the past 3 months. She denies any suicidal thoughts. Now to top things off, for the past 3 days she has had urinary burning and urgency. No fever or back pain. She is drinking plenty of fluids.   Review of Systems  Constitutional:  Positive for fatigue. Negative for fever.  Respiratory: Negative.    Cardiovascular: Negative.   Gastrointestinal: Negative.   Genitourinary:  Positive for dysuria, frequency and urgency. Negative for flank pain and hematuria.  Psychiatric/Behavioral:  Positive for decreased concentration, dysphoric mood and sleep disturbance. Negative for agitation, behavioral problems, confusion, hallucinations, self-injury and suicidal ideas. The patient is nervous/anxious.       Objective:   Physical Exam Constitutional:      Appearance: Normal  appearance.  Cardiovascular:     Rate and Rhythm: Normal rate and regular rhythm.     Pulses: Normal pulses.     Heart sounds: Normal heart sounds.  Pulmonary:     Effort: Pulmonary effort is normal.     Breath sounds: Normal breath sounds.  Abdominal:     General: Abdomen is flat. Bowel sounds are normal. There is no distension.     Palpations: Abdomen is soft. There is no mass.     Tenderness: There is no abdominal tenderness. There is no right CVA tenderness, left CVA tenderness, guarding or rebound.     Hernia: No hernia is present.  Neurological:     Mental Status: She is alert.  Psychiatric:        Behavior: Behavior normal.        Thought Content: Thought content normal.     Comments: She has a depressed affect and she is tearful           Assessment & Plan:  For the depression and anxiety, we will stop the Pristiq, we will increase the Cymbalta to 60 mg daily and we will add Wellbutrin XL 150 mg daily. She can continue to use Xanax as needed. For insomnia, we will stop the Zolpidem and try Zolpidem CR 12.5 mg at bedtime. For the UTI, we will treat it with Cipro for 7 days and we will culture the sample. I asked her  to follow up with me in 3 weeks. We spent 35 minutes reviewing records and discussing these issues.  Alysia Penna, MD

## 2021-07-20 LAB — URINE CULTURE
MICRO NUMBER:: 12152462
SPECIMEN QUALITY:: ADEQUATE

## 2021-07-21 ENCOUNTER — Ambulatory Visit: Payer: Self-pay | Admitting: Obstetrics and Gynecology

## 2021-07-21 NOTE — Progress Notes (Signed)
Can you please result patients lab results

## 2021-07-24 ENCOUNTER — Telehealth (INDEPENDENT_AMBULATORY_CARE_PROVIDER_SITE_OTHER): Payer: 59 | Admitting: Family Medicine

## 2021-07-24 ENCOUNTER — Encounter: Payer: Self-pay | Admitting: Family Medicine

## 2021-07-24 DIAGNOSIS — F419 Anxiety disorder, unspecified: Secondary | ICD-10-CM | POA: Diagnosis not present

## 2021-07-24 DIAGNOSIS — F32A Depression, unspecified: Secondary | ICD-10-CM | POA: Diagnosis not present

## 2021-07-24 NOTE — Patient Instructions (Addendum)
Schedule follow up with Dr. Sarajane Jews as soon as possible early next week.   In the interim you have opted to try: -decreasing the Cymbalta to '30mg'$  once daily -decrease the ambien to '5mg'$  (half of your old dose) -stop the wellbutrin if any worsening, new symptoms or if not improving with the above measures -seek prompt psychiatry or inperson with primary care office,urgent care or medcenter if any worsening, new symptoms or you are not improving   Call 911 and/or seek emergency care if any severe or life threatening symptoms, high fever, irregular heart beat, seizures, passing out, thoughts of suicide or harm, hallucination, or worsening rapidly.    I hope you are feeling better soon!  It was nice to meet you today. I help [facility] out with telemedicine visits on Tuesdays and Thursdays and am available for visits on those days. If you have any concerns or questions following this visit please schedule a follow up visit with your Primary Care doctor or seek care at a local urgent care clinic to avoid delays in care.

## 2021-07-24 NOTE — Progress Notes (Signed)
Virtual Visit via Video Note  I connected with Stacy Hays  on 07/24/21 at  4:20 PM EDT by a video enabled telemedicine application and verified that I am speaking with the correct person using two identifiers.  Location patient: home, [place] Location provider:work or home office Persons participating in the virtual visit: patient, provider  I discussed the limitations of evaluation and management by telemedicine and the availability of in person appointments. The patient expressed understanding and agreed to proceed.   HPI:  Acute telemedicine visit for anxiety and depression: -increased stress recently and medication concerns -seeing psychiatry - recently started on pristiq and hydroxyzine (along with the 30 mg of cymbalta she was already on) by psychiatry several weeks to a month ago per her report -she talked with Dr. Sarajane Jews last Wednesday and reports he did not agree with the care plan by the psychiatrist (reports she felt uncomfortable with the psychiatry plan as well) -reports PCP stopped the pristiq, hydroxyzine and started wellbutrinXL 150, increased cymbalta dose to '60mg'$  and increased her dose of ambien -she reports she has taken cymbalta '30mg'$  and wellbutrin 150 in the past and did well -Symptoms include: she feels more anxious since all of these changes, some headaches, poor cognition, feels jittery at times (some of this started before the medication changes, but seems a little worse since last week) - she is worried about drug interactions -Denies:SI, hallucinations, twitching, thoughts of harm, fever, CP, SOB, GI symptoms   ROS: See pertinent positives and negatives per HPI.  Past Medical History:  Diagnosis Date   Arthritis    Depression    Dysfunctional uterine bleeding    Family history of breast cancer    Headache(784.0)    Heart murmur    Herniated disc    Insomnia    Migraine without aura    Overactive bladder    Plantar fasciitis     Past Surgical History:  Procedure  Laterality Date   ABDOMINOPLASTY  2006   BREAST BIOPSY Right 2013   CYSTOCELE REPAIR     with endometrial ablation   ENDOMETRIAL ABLATION  2010   LEEP  1993   had 2 in the same year    [place]  2007     Current Outpatient Medications:    ALPRAZolam (XANAX) 0.25 MG tablet, Take 1 tablet (0.25 mg total) by mouth 3 (three) times daily as needed for anxiety., Disp: 90 tablet, Rfl: 5   buPROPion (WELLBUTRIN XL) 150 MG 24 hr tablet, Take 1 tablet (150 mg total) by mouth daily., Disp: 30 tablet, Rfl: 2   ciprofloxacin (CIPRO) 500 MG tablet, Take 1 tablet (500 mg total) by mouth 2 (two) times daily., Disp: 14 tablet, Rfl: 0   DULoxetine (CYMBALTA) 60 MG capsule, Take 1 capsule (60 mg total) by mouth daily. (Patient taking differently: Take 60 mg by mouth at bedtime.), Disp: 30 capsule, Rfl: 3   Misc Natural Products (GLUCOS-CHONDROIT-MSM COMPLEX PO), Take 1 tablet by mouth daily., Disp: , Rfl:    Multiple Vitamin (MULTIVITAMIN) tablet, Take 1 tablet by mouth daily., Disp: , Rfl:    ondansetron (ZOFRAN-ODT) 8 MG disintegrating tablet, DISSOLVE 1 TABLET ON THE  TONGUE EVERY 8 HOURS AS  NEEDED FOR NAUSEA AND  VOMITING, Disp: 120 tablet, Rfl: 0   SUMAtriptan (IMITREX) 100 MG tablet, TAKE 1 TABLET BY MOUTH AS NEEDED FOR HEADACHE, Disp: 10 tablet, Rfl: 11   traMADol (ULTRAM) 50 MG tablet, TAKE 1 TABLET(50 MG) BY MOUTH EVERY 6 HOURS, Disp:  120 tablet, Rfl: 5   valACYclovir (VALTREX) 500 MG tablet, Take 1 tablet (500 mg total) by mouth 2 (two) times daily as needed (fever blisters)., Disp: 60 tablet, Rfl: 5   VITAMIN D PO, Take by mouth daily., Disp: , Rfl:    zolpidem (AMBIEN CR) 12.5 MG CR tablet, Take 1 tablet (12.5 mg total) by mouth at bedtime as needed for sleep., Disp: 30 tablet, Rfl: 2  EXAM:  VITALS per patient if applicable:  GENERAL: alert, oriented, appears well and in no acute distress  HEENT: atraumatic, conjunttiva clear, PER, pupils do not appear dilated, no obvious  abnormalities on inspection of external nose and ears  NECK: normal movements of the head and neck  LUNGS: on inspection no signs of respiratory distress, breathing rate appears normal, no obvious gross SOB, gasping or wheezing  CV: no obvious cyanosis  MS: moves all visible extremities without noticeable abnormality  PSYCH/NEURO: pleasant and cooperative, no obvious depression or anxiety, speech and thought processing grossly intact, she hold out her hand and it does not appear to be tremulous at the time of this visit - she reports is better now but has felt shaky  ASSESSMENT AND PLAN:  Discussed the following assessment and plan:  Anxiety and depression  -we discussed possible serious and likely etiologies, options for evaluation and workup, limitations of telemedicine visit vs in person visit, treatment, treatment risks and precautions. Pt prefers to treat via telemedicine empirically rather than in person at this moment. I really feel she needs to see her PCP (or a psychiatrist) as has had too many different providers advising her recently. Unfortunately, she reports her PCP was not available today however, and is not available until next week. Talked through the possibility that this is an adjustment phase due to many medication changes, worsening anxiety versus possible drug interactions, SS, drug side effects vs other. She does not want to see psychiatry. Advised stopping the wellbutrin and/or the cymbalta, than gradual titration with PCP if needed, but she feels has tolerated these well in the past together at lower doses and does not wish to stop. Advised of potential serious interaction and SS.  She wants to try backing down on the cymbalta dose to '30mg'$  and decreasing to 5 mg of regular ambien (half of her old dose) for now. Advised if any worsening, new symptoms or not improving to stop the wellbutrin and seek follow up care promptly. Advised her to call her PCP office first thing in  the morning and schedule the next available follow up with her PCP early next week. Sent PCP office a message as well and copied PCP on this visit note.  Advised to seek prompt in person care or psychiatry follow up in the interim if worsening, new symptoms arise, or if is not improving with treatment. Discussed options for inperson care if PCP office not available. Discussed emergency care options as well. Did let this patient know that I only do telemedicine on Tuesdays and Thursdays for [place]. Advised to schedule follow up visit with PCP or [facility] if any further questions or concerns to avoid delays in care.   I discussed the assessment and treatment plan with the patient. > 30 minutes spent on this vist on review of history, counseling, discussion of treatment options and care. The patient was provided an opportunity to ask questions and all were answered. The patient agreed with the plan and demonstrated an understanding of the instructions.     Nickola Major  Maudie Mercury, DO

## 2021-08-04 NOTE — Progress Notes (Signed)
GYNECOLOGY  VISIT   HPI: 50 y.o.   Married  Caucasian  female   G3P3000 with Patient's last menstrual period was 01/28/2021 (approximate).   here for consult. Patient has been seeing PCP for depression since 04/2021. She felt really "down". 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   She is being treated with Cymbalta and Wellbutrin.  She feels like she is improved. TSH 2.41 on 07/18/21.  Patient wants to know if any of this could be hormonal. Feels like she will start her period, but does not. Has night sweats.  Uses fans to control the heat.  Some hot flashes during the day.   Has an intraductal papilloma and has not had excision to date.  She is planning surgery in November.   Has AEX scheduled 09-04-21.  GYNECOLOGIC HISTORY: Patient's last menstrual period was 01/28/2021 (approximate). Contraception: Vasectomy/Failed Ablation 2009 Menopausal hormone therapy: none Last mammogram:  11-12-20 MR BR.BIL--See Epic Last pap smear:  06-12-19 Neg:Neg HR HPV--Dr.K. Harrington Challenger, 04-07-17 Neg:Neg HR HPV, 03-26-16 Neg:Neg HR HPV. Hx of LEEP x2 1993.        OB History     Gravida  3   Para  3   Term  3   Preterm      AB      Living         SAB      IAB      Ectopic      Multiple      Live Births                 Patient Active Problem List   Diagnosis Date Noted   Depression with anxiety 02/12/2021   Genetic testing 01/16/2021   Adenomyosis 07/05/2019   Paratubal cyst 07/05/2019   Family history of breast cancer    OBESITY 08/21/2010   DYSFUNCTIONAL UTERINE BLEEDING 02/04/2010   INSOMNIA 01/31/2009   OSTEOARTHRITIS 11/17/2007   Myalgia 11/17/2007   SEIZURES, FEBRILE 11/17/2007   Headache 11/17/2007   INCONTINENCE WITHOUT SENSORY AWARENESS 11/17/2007    Past Medical History:  Diagnosis Date   Arthritis    Depression    Dysfunctional uterine bleeding    Family history of breast cancer    Headache(784.0)    Heart murmur    Herniated disc     Insomnia    Migraine without aura    Overactive bladder    Plantar fasciitis     Past Surgical History:  Procedure Laterality Date   ABDOMINOPLASTY  2006   BREAST BIOPSY Right 2013   CYSTOCELE REPAIR     with endometrial ablation   ENDOMETRIAL ABLATION  2010   LEEP  1993   had 2 in the same year    [place]  2007    Current Outpatient Medications  Medication Sig Dispense Refill   ALPRAZolam (XANAX) 0.25 MG tablet Take 1 tablet (0.25 mg total) by mouth 3 (three) times daily as needed for anxiety. 90 tablet 5   buPROPion (WELLBUTRIN XL) 150 MG 24 hr tablet Take 1 tablet (150 mg total) by mouth daily. 30 tablet 2   DULoxetine (CYMBALTA) 60 MG capsule Take 1 capsule (60 mg total) by mouth daily. (Patient taking differently: Take 60 mg by mouth at bedtime.) 30 capsule 3   Misc Natural Products (GLUCOS-CHONDROIT-MSM COMPLEX PO) Take 1 tablet by mouth daily.     Multiple Vitamin (MULTIVITAMIN) tablet Take 1 tablet by mouth daily.     ondansetron (ZOFRAN-ODT)  8 MG disintegrating tablet DISSOLVE 1 TABLET ON THE  TONGUE EVERY 8 HOURS AS  NEEDED FOR NAUSEA AND  VOMITING 120 tablet 0   SUMAtriptan (IMITREX) 100 MG tablet TAKE 1 TABLET BY MOUTH AS NEEDED FOR HEADACHE 10 tablet 11   traMADol (ULTRAM) 50 MG tablet TAKE 1 TABLET(50 MG) BY MOUTH EVERY 6 HOURS 120 tablet 5   valACYclovir (VALTREX) 500 MG tablet Take 1 tablet (500 mg total) by mouth 2 (two) times daily as needed (fever blisters). 60 tablet 5   VITAMIN D PO Take by mouth daily.     zolpidem (AMBIEN CR) 12.5 MG CR tablet Take 1 tablet (12.5 mg total) by mouth at bedtime as needed for sleep. 30 tablet 2   No current facility-administered medications for this visit.     ALLERGIES: Promethazine hcl  Family History  Problem Relation Age of Onset   Diabetes Mother 60   Hyperlipidemia Mother 49   Hypertension Mother    Breast cancer Mother 75   Heart disease Mother    Kidney disease Other    Prostate cancer Other     Sudden death Other    Coronary artery disease Other    Heart disease Maternal Grandfather 37   Birth defects Paternal Grandmother    Breast cancer Paternal Grandmother    Heart disease Paternal Grandfather    Diabetes Paternal Grandfather    Stroke Paternal Grandfather    Breast cancer Maternal Grandmother 65   Cervical cancer Maternal Aunt    Mental retardation Maternal Aunt    Cerebral palsy Maternal Aunt    Breast cancer Cousin        dx in her 23s, mat first cousin   Breast cancer Cousin        mat first cousin    Social History   Socioeconomic History   Marital status: Married    Spouse name: Not on file   Number of children: 3   Years of education: Not on file   Highest education level: Not on file  Occupational History   Not on file  Tobacco Use   Smoking status: Never   Smokeless tobacco: Never  Vaping Use   Vaping Use: Never used  Substance and Sexual Activity   Alcohol use: Yes    Comment: 2   Drug use: No   Sexual activity: Yes    Partners: Male    Birth control/protection: Surgical    Comment: vasectomy & pt had ablation  Other Topics Concern   Not on file  Social History Narrative   Not on file   Social Determinants of Health   Financial Resource Strain: Not on file  Food Insecurity: Not on file  Transportation Needs: Not on file  Physical Activity: Not on file  Stress: Not on file  Social Connections: Not on file  Intimate Partner Violence: Not on file    Review of Systems  Psychiatric/Behavioral:  Positive for agitation and sleep disturbance.        Depression  All other systems reviewed and are negative.  PHYSICAL EXAMINATION:    BP 120/82   Pulse 87   LMP 01/28/2021 (Approximate)   SpO2 98%     General appearance: alert, cooperative and appears stated age  ASSESSMENT  Depression.  Menopausal symptoms.  Perimenopausal female. FH breast cancer.  Lifetime risk of breast cancer 19.8%. Intraductal papilloma. Not excised.   Negative genetic testing.  Status post endometrial ablation.   PLAN  We discussed menopausal changes and  effect on mood, sleep quality, and functionality. We reviewed HRT, Gabapentin, Paxil and Effexor for treatment of menopausal symptoms.  Will check FSH, and estradiol.   Brochure on menopause to patient.  FU for annual exam an prn.  An After Visit Summary was printed and given to the patient.  ___34 min___ minutes face to face time of which over 50% was spent in counseling.

## 2021-08-06 ENCOUNTER — Encounter: Payer: Self-pay | Admitting: Obstetrics and Gynecology

## 2021-08-06 ENCOUNTER — Ambulatory Visit: Payer: 59 | Admitting: Obstetrics and Gynecology

## 2021-08-06 ENCOUNTER — Other Ambulatory Visit: Payer: Self-pay

## 2021-08-06 VITALS — BP 120/82 | HR 87

## 2021-08-06 DIAGNOSIS — F32A Depression, unspecified: Secondary | ICD-10-CM | POA: Diagnosis not present

## 2021-08-06 DIAGNOSIS — N951 Menopausal and female climacteric states: Secondary | ICD-10-CM

## 2021-08-06 NOTE — Patient Instructions (Addendum)
Consider Paxil or Effexor for an antidepressant that also treats menopausal hot flashes.   Neurontin is used to treat night time hot flashes.   All of these options are nonhormonal.   Hormone therapy is also a consideration.

## 2021-08-07 ENCOUNTER — Other Ambulatory Visit: Payer: Self-pay

## 2021-08-07 LAB — ESTRADIOL: Estradiol: 62 pg/mL

## 2021-08-07 LAB — FOLLICLE STIMULATING HORMONE: FSH: 17 m[IU]/mL

## 2021-08-08 ENCOUNTER — Encounter: Payer: Self-pay | Admitting: Family Medicine

## 2021-08-08 ENCOUNTER — Ambulatory Visit: Payer: 59 | Admitting: Family Medicine

## 2021-08-08 VITALS — BP 124/82 | HR 78 | Temp 98.6°F | Wt 194.0 lb

## 2021-08-08 DIAGNOSIS — H538 Other visual disturbances: Secondary | ICD-10-CM | POA: Diagnosis not present

## 2021-08-08 DIAGNOSIS — F418 Other specified anxiety disorders: Secondary | ICD-10-CM | POA: Diagnosis not present

## 2021-08-08 MED ORDER — DULOXETINE HCL 60 MG PO CPEP: 60.0000 mg | ORAL_CAPSULE | Freq: Every day | ORAL | 3 refills | Status: DC

## 2021-08-08 MED ORDER — BUPROPION HCL ER (XL) 150 MG PO TB24: 150.0000 mg | ORAL_TABLET | Freq: Every day | ORAL | 3 refills | Status: DC

## 2021-08-08 NOTE — Progress Notes (Signed)
   Subjective:    Patient ID: Stacy Hays, female    DOB: 07-Aug-1971, 50 y.o.   MRN: [id number]  HPI Here to follow up on depression and anxiety. As we discussed last time she had quit her old stressful job, and now she is working part time at a new job that she enjoys much more. Her family is still in transition while they try to sell their home and they are renting until their new home is available. At our last visit we stopped Pristiq, we increased the Cymbalta to 60 mg daily, we added Wellbutrin XL 150 mg daily and we changed from Zolpidem to Zolpidem CR for sleep. She says this was a difficult transition. For a few weeks she felt weak, she was mildly nauseated, and she sweated a lot. Her sleep pattern is about the same. She seems to be adjusting to the new regimen however, and over the past week she has felt much better. Her moods have also improved, she is less depressed and less stressed by things. Her only complaint today is blurred vision, which appeared during this transition. She is not sure if the medications have caused it. No eye pain.    Review of Systems  Constitutional: Negative.   Eyes:  Positive for visual disturbance. Negative for photophobia, pain and redness.  Respiratory: Negative.    Cardiovascular: Negative.   Psychiatric/Behavioral: Negative.        Objective:   Physical Exam Constitutional:      Appearance: Normal appearance. She is not ill-appearing.  Eyes:     Extraocular Movements: Extraocular movements intact.     Conjunctiva/sclera: Conjunctivae normal.     Pupils: Pupils are equal, round, and reactive to light.  Cardiovascular:     Rate and Rhythm: Normal rate and regular rhythm.     Pulses: Normal pulses.     Heart sounds: Normal heart sounds.  Pulmonary:     Effort: Pulmonary effort is normal.     Breath sounds: Normal breath sounds.  Neurological:     General: No focal deficit present.     Mental Status: She is alert and oriented to person,  place, and time.  Psychiatric:        Mood and Affect: Mood normal.        Behavior: Behavior normal.        Thought Content: Thought content normal.          Assessment & Plan:  She ia doing much better as far as depression and anxiety. We agreed to stay on the current regimen. As far as the blurred vision, this could certainly be an affect of one of the medications, but hopefully this will resolve soon. She will follow up with [place] in 4 weeks. In the meantime I advised her get an eye exam to make sure nothing else is going on.  We spent 35 minutes reviewing records and discussing these issues.  Alysia Penna, MD

## 2021-09-03 NOTE — Progress Notes (Signed)
50 y.o. G70P3000 Married Caucasian female here for annual exam.    Patient complaining of "hard knot" on left labia. Lump there for a long time.   FSh and E2 indicated perimenopause on 08/06/21. She did have bleeding on 08/25/21.   Cymbalta and Wellbutrin working well.  She is planning surgery for the month of November with Dr. Georgette Dover for a breast papilloma.   PCP: Alysia Penna, MD    Patient's last menstrual period was 08/25/2021 (approximate).     Period Pattern: (!) Irregular     Sexually active: Yes.    The current method of family planning is vasectomy.  Hx of ablation. Exercising: Yes.     walking Smoker:  no  Health Maintenance: Pap: 06-12-2019 Neg:Neg HR HPV--Dr.Ross, 04-07-17 Neg:Neg HR HPV, 03-26-16 Neg:Neg HR HPV History of abnormal Pap:  yes, Hx of LEEP MMG: 11-11-20 -- SEE EPIC Colonoscopy:  NEVER.  Saw Dr. Collene Mares years ago.  BMD:   n/a  Result  n/a TDaP: PCP Gardasil:   no HIV: Unsure Hep C: Neg per patient Screening Labs: Today   reports that she has never smoked. She has never used smokeless tobacco. She reports current alcohol use. She reports that she does not use drugs.  Past Medical History:  Diagnosis Date  . Arthritis   . COVID 06/2021  . Depression   . Dysfunctional uterine bleeding   . Family history of breast cancer   . Headache(784.0)   . Heart murmur   . Herniated disc   . Insomnia   . Migraine without aura   . Overactive bladder   . Plantar fasciitis     Past Surgical History:  Procedure Laterality Date  . ABDOMINOPLASTY  2006  . BREAST BIOPSY Right 2013  . CYSTOCELE REPAIR     with endometrial ablation  . ENDOMETRIAL ABLATION  2010  . LEEP  1993   had 2 in the same year   . [place] SURGERY  2007    Current Outpatient Medications  Medication Sig Dispense Refill  . ALPRAZolam (XANAX) 0.25 MG tablet Take 1 tablet (0.25 mg total) by mouth 3 (three) times daily as needed for anxiety. 90 tablet 5  . buPROPion (WELLBUTRIN XL) 150 MG  24 hr tablet Take 1 tablet (150 mg total) by mouth daily. 90 tablet 3  . DULoxetine (CYMBALTA) 60 MG capsule Take 1 capsule (60 mg total) by mouth daily. 90 capsule 3  . Misc Natural Products (GLUCOS-CHONDROIT-MSM COMPLEX PO) Take 1 tablet by mouth daily.    . Multiple Vitamin (MULTIVITAMIN) tablet Take 1 tablet by mouth daily.    . ondansetron (ZOFRAN-ODT) 8 MG disintegrating tablet DISSOLVE 1 TABLET ON THE  TONGUE EVERY 8 HOURS AS  NEEDED FOR NAUSEA AND  VOMITING 120 tablet 0  . SUMAtriptan (IMITREX) 100 MG tablet TAKE 1 TABLET BY MOUTH AS NEEDED FOR HEADACHE 10 tablet 11  . traMADol (ULTRAM) 50 MG tablet TAKE 1 TABLET(50 MG) BY MOUTH EVERY 6 HOURS 120 tablet 5  . valACYclovir (VALTREX) 500 MG tablet Take 1 tablet (500 mg total) by mouth 2 (two) times daily as needed (fever blisters). 60 tablet 5  . VITAMIN D PO Take by mouth daily.    Marland Kitchen zolpidem (AMBIEN CR) 12.5 MG CR tablet Take 1 tablet (12.5 mg total) by mouth at bedtime as needed for sleep. 30 tablet 2   No current facility-administered medications for this visit.    Family History  Problem Relation Age of Onset  .  Diabetes Mother 58  . Hyperlipidemia Mother 58  . Hypertension Mother   . Breast cancer Mother 14  . Heart disease Mother   . Hypertension Father   . Cervical cancer Maternal Aunt   . Mental retardation Maternal Aunt   . Cerebral palsy Maternal Aunt   . Breast cancer Maternal Grandmother 37  . Heart disease Maternal Grandfather 42  . Birth defects Paternal Grandmother   . Breast cancer Paternal Grandmother   . Heart disease Paternal Grandfather   . Diabetes Paternal Grandfather   . Stroke Paternal Grandfather   . Breast cancer Cousin        dx in her 81s, mat first cousin  . Breast cancer Cousin        mat first cousin  . Kidney disease Other   . Prostate cancer Other   . Sudden death Other   . Coronary artery disease Other     Review of Systems  All other systems reviewed and are negative.  Exam:   BP  128/82   Pulse 85   Ht 5' 5.25" (1.657 m)   Wt 198 lb (89.8 kg)   LMP 08/25/2021 (Approximate)   SpO2 96%   BMI 32.70 kg/m     General appearance: alert, cooperative and appears stated age Head: normocephalic, without obvious abnormality, atraumatic Neck: no adenopathy, supple, symmetrical, trachea midline and thyroid normal to inspection and palpation Lungs: clear to auscultation bilaterally Breasts: normal appearance, no masses or tenderness, No nipple retraction or dimpling, No nipple discharge or bleeding, No axillary adenopathy Heart: regular rate and rhythm Abdomen: soft, non-tender; no masses, no organomegaly Extremities: extremities normal, atraumatic, no cyanosis or edema Skin: skin color, texture, turgor normal. No rashes or lesions Lymph nodes: cervical, supraclavicular, and axillary nodes normal. Neurologic: grossly normal  Pelvic: External genitalia:   2 mm white raised area of left labia majora, skin tag?  Pierced clitoris.               No abnormal inguinal nodes palpated.              Urethra:  normal appearing urethra with no masses, tenderness or lesions              Bartholins and Skenes: normal                 Vagina: normal appearing vagina with normal color and discharge, no lesions              Cervix: no lesions              Pap taken: no Bimanual Exam:  Uterus:  normal size, contour, position, consistency, mobility, non-tender              Adnexa: no mass, fullness, tenderness              Rectal exam: yes.  Confirms.              Anus:  normal sphincter tone, no lesions  Chaperone was present for exam:  Estill Bamberg, CMA  Assessment:   Well woman visit with gynecologic exam. Perimenopausal female. FH breast cancer.  Lifetime risk of breast cancer 19.8%. Right breast intraductal papilloma. Not excised.  Negative genetic testing.  Status post endometrial ablation.  Vulvar skin tag/lump.  Plan: Mammogram screening discussed. Self breast awareness  reviewed. She will schedule breast papilloma excision.  Pap and HR HPV as above. Guidelines for Calcium, Vitamin D, regular exercise program including cardiovascular and weight  bearing exercise. Routine labs.  I offered return visit for excision of labial skin tag/lump.  She will consider and call back if desired.  Follow up annually and prn.   After visit summary provided.

## 2021-09-04 ENCOUNTER — Ambulatory Visit (INDEPENDENT_AMBULATORY_CARE_PROVIDER_SITE_OTHER): Payer: 59 | Admitting: Obstetrics and Gynecology

## 2021-09-04 ENCOUNTER — Other Ambulatory Visit: Payer: Self-pay

## 2021-09-04 ENCOUNTER — Encounter: Payer: Self-pay | Admitting: Obstetrics and Gynecology

## 2021-09-04 VITALS — BP 128/82 | HR 85 | Ht 65.25 in | Wt 198.0 lb

## 2021-09-04 DIAGNOSIS — Z01419 Encounter for gynecological examination (general) (routine) without abnormal findings: Secondary | ICD-10-CM | POA: Diagnosis not present

## 2021-09-04 LAB — COMPREHENSIVE METABOLIC PANEL
AG Ratio: 2 (calc) (ref 1.0–2.5)
ALT: 16 U/L (ref 6–29)
AST: 16 U/L (ref 10–35)
Albumin: 4.3 g/dL (ref 3.6–5.1)
Alkaline phosphatase (APISO): 90 U/L (ref 37–153)
BUN: 17 mg/dL (ref 7–25)
CO2: 30 mmol/L (ref 20–32)
Calcium: 9.5 mg/dL (ref 8.6–10.4)
Chloride: 101 mmol/L (ref 98–110)
Creat: 0.82 mg/dL (ref 0.50–1.03)
Globulin: 2.2 g/dL (calc) (ref 1.9–3.7)
Glucose, Bld: 86 mg/dL (ref 65–99)
Potassium: 4.8 mmol/L (ref 3.5–5.3)
Sodium: 138 mmol/L (ref 135–146)
Total Bilirubin: 0.4 mg/dL (ref 0.2–1.2)
Total Protein: 6.5 g/dL (ref 6.1–8.1)

## 2021-09-04 LAB — CBC WITH DIFFERENTIAL/PLATELET
Absolute Monocytes: 435 cells/uL (ref 200–950)
Basophils Absolute: 74 cells/uL (ref 0–200)
Basophils Relative: 0.9 %
Eosinophils Absolute: 303 cells/uL (ref 15–500)
Eosinophils Relative: 3.7 %
HCT: 43.4 % (ref 35.0–45.0)
Hemoglobin: 14.3 g/dL (ref 11.7–15.5)
Lymphs Abs: 1566 cells/uL (ref 850–3900)
MCH: 29.2 pg (ref 27.0–33.0)
MCHC: 32.9 g/dL (ref 32.0–36.0)
MCV: 88.8 fL (ref 80.0–100.0)
MPV: 10.2 fL (ref 7.5–12.5)
Monocytes Relative: 5.3 %
Neutro Abs: 5822 cells/uL (ref 1500–7800)
Neutrophils Relative %: 71 %
Platelets: 374 10*3/uL (ref 140–400)
RBC: 4.89 10*6/uL (ref 3.80–5.10)
RDW: 13.2 % (ref 11.0–15.0)
Total Lymphocyte: 19.1 %
WBC: 8.2 10*3/uL (ref 3.8–10.8)

## 2021-09-04 LAB — LIPID PANEL
Cholesterol: 233 mg/dL — ABNORMAL HIGH (ref <200)
HDL: 73 mg/dL (ref >=50)
LDL Cholesterol (Calc): 141 mg/dL (calc) — ABNORMAL HIGH
Non-HDL Cholesterol (Calc): 160 mg/dL (calc) — ABNORMAL HIGH (ref <130)
Total CHOL/HDL Ratio: 3.2 (calc) (ref <5.0)
Triglycerides: 83 mg/dL (ref <150)

## 2021-09-04 NOTE — Patient Instructions (Signed)

## 2021-09-09 ENCOUNTER — Telehealth (INDEPENDENT_AMBULATORY_CARE_PROVIDER_SITE_OTHER): Payer: 59 | Admitting: Family Medicine

## 2021-09-09 ENCOUNTER — Encounter: Payer: Self-pay | Admitting: Family Medicine

## 2021-09-09 DIAGNOSIS — F418 Other specified anxiety disorders: Secondary | ICD-10-CM

## 2021-09-09 DIAGNOSIS — G4700 Insomnia, unspecified: Secondary | ICD-10-CM

## 2021-09-09 NOTE — Progress Notes (Signed)
Subjective:    Patient ID: Stacy Hays, female    DOB: 05-28-1971, 50 y.o.   MRN: [id number]  HPI Virtual Visit via Video Note  I connected with the patient on 09/09/21 at 11:00 AM EDT by a video enabled telemedicine application and verified that I am speaking with the correct person using two identifiers.  Location patient: home Location provider:work or home office Persons participating in the virtual visit: patient, provider  I discussed the limitations of evaluation and management by telemedicine and the availability of in person appointments. The patient expressed understanding and agreed to proceed.   HPI: Here to follow up on depression, anxiety, and insomnia. She is feeling much better now and her moods are more stable. Her family finally sold their house, so this has taken away a huge source of stress in her life. She feels happier, she is sleeping well, and her appetite is back to normal. Also last month she mentioned some blurred vision. She saw her eye doctor recently an dit turns out she needs reading glasses.    ROS: See pertinent positives and negatives per HPI.  Past Medical History:  Diagnosis Date   Arthritis    COVID 06/2021   Depression    Dysfunctional uterine bleeding    Family history of breast cancer    Headache(784.0)    Heart murmur    Herniated disc    Insomnia    Migraine without aura    Overactive bladder    Plantar fasciitis     Past Surgical History:  Procedure Laterality Date   ABDOMINOPLASTY  2006   BREAST BIOPSY Right 2013   CYSTOCELE REPAIR     with endometrial ablation   ENDOMETRIAL ABLATION  2010   LEEP  1993   had 2 in the same year    [place]  2007    Family History  Problem Relation Age of Onset   Diabetes Mother 65   Hyperlipidemia Mother 38   Hypertension Mother    Breast cancer Mother 75   Heart disease Mother    Hypertension Father    Cervical cancer Maternal Aunt    Mental retardation Maternal  Aunt    Cerebral palsy Maternal Aunt    Breast cancer Maternal Grandmother 94   Heart disease Maternal Grandfather 6   Birth defects Paternal Grandmother    Breast cancer Paternal Grandmother    Heart disease Paternal Grandfather    Diabetes Paternal Grandfather    Stroke Paternal Grandfather    Breast cancer Cousin        dx in her 29s, mat first cousin   Breast cancer Cousin        mat first cousin   Kidney disease Other    Prostate cancer Other    Sudden death Other    Coronary artery disease Other      Current Outpatient Medications:    ALPRAZolam (XANAX) 0.25 MG tablet, Take 1 tablet (0.25 mg total) by mouth 3 (three) times daily as needed for anxiety., Disp: 90 tablet, Rfl: 5   buPROPion (WELLBUTRIN XL) 150 MG 24 hr tablet, Take 1 tablet (150 mg total) by mouth daily., Disp: 90 tablet, Rfl: 3   DULoxetine (CYMBALTA) 60 MG capsule, Take 1 capsule (60 mg total) by mouth daily., Disp: 90 capsule, Rfl: 3   Misc Natural Products (GLUCOS-CHONDROIT-MSM COMPLEX PO), Take 1 tablet by mouth daily., Disp: , Rfl:    Multiple Vitamin (MULTIVITAMIN) tablet, Take 1 tablet by mouth  daily., Disp: , Rfl:    ondansetron (ZOFRAN-ODT) 8 MG disintegrating tablet, DISSOLVE 1 TABLET ON THE  TONGUE EVERY 8 HOURS AS  NEEDED FOR NAUSEA AND  VOMITING, Disp: 120 tablet, Rfl: 0   SUMAtriptan (IMITREX) 100 MG tablet, TAKE 1 TABLET BY MOUTH AS NEEDED FOR HEADACHE, Disp: 10 tablet, Rfl: 11   traMADol (ULTRAM) 50 MG tablet, TAKE 1 TABLET(50 MG) BY MOUTH EVERY 6 HOURS, Disp: 120 tablet, Rfl: 5   valACYclovir (VALTREX) 500 MG tablet, Take 1 tablet (500 mg total) by mouth 2 (two) times daily as needed (fever blisters)., Disp: 60 tablet, Rfl: 5   VITAMIN D PO, Take by mouth daily., Disp: , Rfl:    zolpidem (AMBIEN CR) 12.5 MG CR tablet, Take 1 tablet (12.5 mg total) by mouth at bedtime as needed for sleep., Disp: 30 tablet, Rfl: 2  EXAM:  VITALS per patient if applicable:  GENERAL: alert, oriented, appears  well and in no acute distress  HEENT: atraumatic, conjunttiva clear, no obvious abnormalities on inspection of external nose and ears  NECK: normal movements of the head and neck  LUNGS: on inspection no signs of respiratory distress, breathing rate appears normal, no obvious gross SOB, gasping or wheezing  CV: no obvious cyanosis  MS: moves all visible extremities without noticeable abnormality  PSYCH/NEURO: pleasant and cooperative, no obvious depression or anxiety, speech and thought processing grossly intact  ASSESSMENT AND PLAN: Her depression with anxiety is now well controlled on Cymbalta and Wellbutrin. She sleeps well with Ambien CR. We will stay on this regimen, and we will plan to follow up in 90 days.  Alysia Penna, MD  Discussed the following assessment and plan:  No diagnosis found.     I discussed the assessment and treatment plan with the patient. The patient was provided an opportunity to ask questions and all were answered. The patient agreed with the plan and demonstrated an understanding of the instructions.   The patient was advised to call back or seek an in-person evaluation if the symptoms worsen or if the condition fails to improve as anticipated.      Review of Systems     Objective:   Physical Exam        Assessment & Plan:

## 2021-09-22 ENCOUNTER — Telehealth: Payer: Self-pay | Admitting: Family Medicine

## 2021-09-22 DIAGNOSIS — Z0000 Encounter for general adult medical examination without abnormal findings: Secondary | ICD-10-CM

## 2021-09-22 NOTE — Telephone Encounter (Signed)
Pt call and stated she need a referral for a colonoscopy sent to dr. Collene Mares office.

## 2021-09-23 NOTE — Telephone Encounter (Signed)
The referral was done  

## 2021-09-23 NOTE — Telephone Encounter (Signed)
Lvm informing patient that referral has been placed.

## 2021-10-14 ENCOUNTER — Other Ambulatory Visit: Payer: Self-pay | Admitting: Family Medicine

## 2021-10-14 NOTE — Telephone Encounter (Signed)
Pt LOV was 09/09/2021 Last refill done 07/18/2021 Please advise

## 2021-10-29 ENCOUNTER — Other Ambulatory Visit: Payer: Self-pay

## 2021-10-29 ENCOUNTER — Ambulatory Visit (INDEPENDENT_AMBULATORY_CARE_PROVIDER_SITE_OTHER): Payer: 59 | Admitting: Family Medicine

## 2021-10-29 VITALS — BP 110/70 | HR 95 | Temp 97.9°F | Wt 202.9 lb

## 2021-10-29 DIAGNOSIS — J36 Peritonsillar abscess: Secondary | ICD-10-CM

## 2021-10-29 DIAGNOSIS — J029 Acute pharyngitis, unspecified: Secondary | ICD-10-CM

## 2021-10-29 LAB — POC COVID19 BINAXNOW: SARS Coronavirus 2 Ag: NEGATIVE

## 2021-10-29 LAB — POCT RAPID STREP A (OFFICE): Rapid Strep A Screen: NEGATIVE

## 2021-10-29 LAB — POC INFLUENZA A&B (BINAX/QUICKVUE)
Influenza A, POC: NEGATIVE
Influenza B, POC: NEGATIVE

## 2021-10-29 MED ORDER — AMOXICILLIN-POT CLAVULANATE 875-125 MG PO TABS: 1.0000 | ORAL_TABLET | Freq: Two times a day (BID) | ORAL | 0 refills | Status: DC
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
## 2021-11-14 ENCOUNTER — Other Ambulatory Visit: Payer: Self-pay | Admitting: Family Medicine

## 2021-11-15 ENCOUNTER — Other Ambulatory Visit: Payer: Self-pay | Admitting: Family Medicine

## 2021-11-17 ENCOUNTER — Other Ambulatory Visit: Payer: Self-pay

## 2021-11-17 ENCOUNTER — Encounter (HOSPITAL_BASED_OUTPATIENT_CLINIC_OR_DEPARTMENT_OTHER): Payer: Self-pay | Admitting: Surgery

## 2021-11-17 NOTE — Telephone Encounter (Signed)
  Spoke with pt,advised that she has more refills at her pharmacy. Pt state that she wants to go back to the previous prescription that she was taking since what she is currently on is very expensive and she does not feel any changes. Pt advised that Dr Sarajane Jews is out of the office for 1 week, pt state that she will refill the old Rx and request that Dr Sarajane Jews switch her back to the regular Ambien when he gets back to the office. Pt is ok waiting until Dr Sarajane Jews is back to the office

## 2021-11-19 ENCOUNTER — Ambulatory Visit: Payer: Self-pay | Admitting: Surgery

## 2021-11-19 ENCOUNTER — Ambulatory Visit: Payer: 59 | Admitting: Family Medicine

## 2021-11-25 ENCOUNTER — Ambulatory Visit
Admission: RE | Admit: 2021-11-25 | Discharge: 2021-11-25 | Disposition: A | Discharge status: Home / Self Care | Payer: 59 | Source: Ambulatory Visit | Attending: Surgery | Admitting: Surgery

## 2021-11-25 ENCOUNTER — Other Ambulatory Visit: Payer: Self-pay | Admitting: Family Medicine

## 2021-11-25 DIAGNOSIS — D241 Benign neoplasm of right breast: Secondary | ICD-10-CM

## 2021-11-25 MED ORDER — ZOLPIDEM TARTRATE 10 MG PO TABS: 10.0000 mg | ORAL_TABLET | Freq: Every evening | ORAL | 5 refills | Status: DC | PRN

## 2021-11-25 NOTE — Addendum Note (Signed)
Addended by: Alysia Penna A on: 11/25/2021 06:44 AM   Modules accepted: Orders

## 2021-11-25 NOTE — Progress Notes (Signed)

## 2021-11-25 NOTE — Anesthesia Preprocedure Evaluation (Addendum)
Anesthesia Evaluation  Patient identified by MRN, date of birth, ID band Patient awake    Reviewed: Allergy & Precautions, NPO status , Patient's Chart, lab work & pertinent test results  History of Anesthesia Complications Negative for: history of anesthetic complications  Airway Mallampati: I  TM Distance: >3 FB Neck ROM: Full    Dental  (+) Teeth Intact, Dental Advisory Given   Pulmonary neg pulmonary ROS,    breath sounds clear to auscultation       Cardiovascular negative cardio ROS   Rhythm:Regular Rate:Normal  '18 stress: EF 60%, low risk   Neuro/Psych  Headaches, Anxiety Depression    GI/Hepatic Neg liver ROS, GERD  Controlled,  Endo/Other  0bese  Renal/GU negative Renal ROS     Musculoskeletal   Abdominal (+) + obese,   Peds  Hematology negative hematology ROS (+)   Anesthesia Other Findings   Reproductive/Obstetrics                            Anesthesia Physical Anesthesia Plan  ASA: 2  Anesthesia Plan: General   Post-op Pain Management: Minimal or no pain anticipated and Tylenol PO (pre-op)   Induction: Intravenous  PONV Risk Score and Plan: 3 and Ondansetron, Dexamethasone and Scopolamine patch - Pre-op  Airway Management Planned: LMA  Additional Equipment: None  Intra-op Plan:   Post-operative Plan:   Informed Consent: I have reviewed the patients History and Physical, chart, labs and discussed the procedure including the risks, benefits and alternatives for the proposed anesthesia with the patient or authorized representative who has indicated his/her understanding and acceptance.     Dental advisory given  Plan Discussed with: CRNA and Surgeon  Anesthesia Plan Comments:        Anesthesia Quick Evaluation

## 2021-11-25 NOTE — Telephone Encounter (Signed)
I sent in for Zolpidem 10 mg tablets. Please call the pharmacy to cancel remaining refills for the CD 12.5 mg tablets

## 2021-11-26 ENCOUNTER — Ambulatory Visit (HOSPITAL_BASED_OUTPATIENT_CLINIC_OR_DEPARTMENT_OTHER)
Admission: RE | Admit: 2021-11-26 | Discharge: 2021-11-26 | Disposition: A | Discharge status: Home / Self Care | Payer: 59 | Attending: Surgery | Admitting: Surgery

## 2021-11-26 ENCOUNTER — Other Ambulatory Visit: Payer: Self-pay

## 2021-11-26 ENCOUNTER — Ambulatory Visit (HOSPITAL_BASED_OUTPATIENT_CLINIC_OR_DEPARTMENT_OTHER): Payer: 59 | Admitting: Anesthesiology

## 2021-11-26 ENCOUNTER — Encounter (HOSPITAL_BASED_OUTPATIENT_CLINIC_OR_DEPARTMENT_OTHER)
Admission: RE | Disposition: A | Discharge status: Home / Self Care | Payer: Self-pay | Source: Home / Self Care | Attending: Surgery

## 2021-11-26 ENCOUNTER — Ambulatory Visit
Admission: RE | Admit: 2021-11-26 | Discharge: 2021-11-26 | Disposition: A | Discharge status: Home / Self Care | Payer: 59 | Source: Ambulatory Visit | Attending: Surgery | Admitting: Surgery

## 2021-11-26 ENCOUNTER — Encounter (HOSPITAL_BASED_OUTPATIENT_CLINIC_OR_DEPARTMENT_OTHER): Payer: Self-pay | Admitting: Surgery

## 2021-11-26 DIAGNOSIS — N6011 Diffuse cystic mastopathy of right breast: Secondary | ICD-10-CM | POA: Insufficient documentation

## 2021-11-26 DIAGNOSIS — D241 Benign neoplasm of right breast: Secondary | ICD-10-CM

## 2021-11-26 DIAGNOSIS — Z803 Family history of malignant neoplasm of breast: Secondary | ICD-10-CM | POA: Diagnosis not present

## 2021-11-26 DIAGNOSIS — F418 Other specified anxiety disorders: Secondary | ICD-10-CM | POA: Diagnosis not present

## 2021-11-26 DIAGNOSIS — K219 Gastro-esophageal reflux disease without esophagitis: Secondary | ICD-10-CM | POA: Diagnosis not present

## 2021-11-26 DIAGNOSIS — N6081 Other benign mammary dysplasias of right breast: Secondary | ICD-10-CM | POA: Insufficient documentation

## 2021-11-26 DIAGNOSIS — R519 Headache, unspecified: Secondary | ICD-10-CM | POA: Diagnosis not present

## 2021-11-26 HISTORY — PX: BREAST LUMPECTOMY WITH RADIOACTIVE SEED LOCALIZATION: SHX6424

## 2021-11-26 LAB — POCT PREGNANCY, URINE: Preg Test, Ur: NEGATIVE

## 2021-11-26 SURGERY — BREAST LUMPECTOMY WITH RADIOACTIVE SEED LOCALIZATION
Anesthesia: General | Site: Breast | Laterality: Right

## 2021-11-26 MED ORDER — DEXAMETHASONE SODIUM PHOSPHATE 10 MG/ML IJ SOLN
INTRAMUSCULAR | Status: AC
  Filled 2021-11-26: qty 1

## 2021-11-26 MED ORDER — CHLORHEXIDINE GLUCONATE CLOTH 2 % EX PADS: 6.0000 | MEDICATED_PAD | Freq: Once | CUTANEOUS | Status: DC

## 2021-11-26 MED ORDER — MIDAZOLAM HCL 2 MG/2ML IJ SOLN: 0.5000 mg | Freq: Once | INTRAMUSCULAR | Status: DC | PRN

## 2021-11-26 MED ORDER — LIDOCAINE HCL (CARDIAC) PF 100 MG/5ML IV SOSY
PREFILLED_SYRINGE | INTRAVENOUS | Status: DC | PRN
  Administered 2021-11-26: 50 mg via INTRAVENOUS

## 2021-11-26 MED ORDER — OXYCODONE HCL 5 MG/5ML PO SOLN: 5.0000 mg | Freq: Once | ORAL | Status: DC | PRN

## 2021-11-26 MED ORDER — ACETAMINOPHEN 500 MG PO TABS
ORAL_TABLET | ORAL | Status: AC
  Filled 2021-11-26: qty 2

## 2021-11-26 MED ORDER — FENTANYL CITRATE (PF) 100 MCG/2ML IJ SOLN
INTRAMUSCULAR | Status: DC | PRN
  Administered 2021-11-26: 100 ug via INTRAVENOUS

## 2021-11-26 MED ORDER — PROPOFOL 500 MG/50ML IV EMUL
INTRAVENOUS | Status: AC
  Filled 2021-11-26: qty 50

## 2021-11-26 MED ORDER — HYDROMORPHONE HCL 1 MG/ML IJ SOLN
0.2500 mg | INTRAMUSCULAR | Status: DC | PRN
  Administered 2021-11-26: 0.5 mg via INTRAVENOUS

## 2021-11-26 MED ORDER — ONDANSETRON HCL 4 MG/2ML IJ SOLN
INTRAMUSCULAR | Status: AC
  Filled 2021-11-26: qty 2

## 2021-11-26 MED ORDER — FENTANYL CITRATE (PF) 100 MCG/2ML IJ SOLN
INTRAMUSCULAR | Status: AC
  Filled 2021-11-26: qty 2

## 2021-11-26 MED ORDER — DEXAMETHASONE SODIUM PHOSPHATE 4 MG/ML IJ SOLN
INTRAMUSCULAR | Status: DC | PRN
  Administered 2021-11-26: 10 mg via INTRAVENOUS

## 2021-11-26 MED ORDER — ACETAMINOPHEN 500 MG PO TABS: 1000.0000 mg | ORAL_TABLET | ORAL | Status: DC

## 2021-11-26 MED ORDER — ONDANSETRON HCL 4 MG/2ML IJ SOLN
INTRAMUSCULAR | Status: DC | PRN
  Administered 2021-11-26: 4 mg via INTRAVENOUS

## 2021-11-26 MED ORDER — OXYCODONE HCL 5 MG PO TABS: 5.0000 mg | ORAL_TABLET | Freq: Once | ORAL | Status: DC | PRN

## 2021-11-26 MED ORDER — PROPOFOL 10 MG/ML IV BOLUS
INTRAVENOUS | Status: DC | PRN
  Administered 2021-11-26: 200 mg via INTRAVENOUS

## 2021-11-26 MED ORDER — LIDOCAINE 2% (20 MG/ML) 5 ML SYRINGE
INTRAMUSCULAR | Status: AC
  Filled 2021-11-26: qty 5

## 2021-11-26 MED ORDER — HYDROMORPHONE HCL 1 MG/ML IJ SOLN
INTRAMUSCULAR | Status: AC
  Filled 2021-11-26: qty 0.5

## 2021-11-26 MED ORDER — BUPIVACAINE-EPINEPHRINE 0.25% -1:200000 IJ SOLN
INTRAMUSCULAR | Status: DC | PRN
  Administered 2021-11-26: 10 mL

## 2021-11-26 MED ORDER — LACTATED RINGERS IV SOLN: INTRAVENOUS | Status: DC

## 2021-11-26 MED ORDER — ATROPINE SULFATE 0.4 MG/ML IV SOLN
INTRAVENOUS | Status: AC
  Filled 2021-11-26: qty 1

## 2021-11-26 MED ORDER — SCOPOLAMINE 1 MG/3DAYS TD PT72
MEDICATED_PATCH | TRANSDERMAL | Status: AC
  Filled 2021-11-26: qty 1

## 2021-11-26 MED ORDER — MIDAZOLAM HCL 2 MG/2ML IJ SOLN
INTRAMUSCULAR | Status: AC
  Filled 2021-11-26: qty 2

## 2021-11-26 MED ORDER — 0.9 % SODIUM CHLORIDE (POUR BTL) OPTIME
TOPICAL | Status: DC | PRN
  Administered 2021-11-26: 300 mL

## 2021-11-26 MED ORDER — MEPERIDINE HCL 25 MG/ML IJ SOLN: 6.2500 mg | INTRAMUSCULAR | Status: DC | PRN

## 2021-11-26 MED ORDER — EPHEDRINE 5 MG/ML INJ
INTRAVENOUS | Status: AC
  Filled 2021-11-26: qty 5

## 2021-11-26 MED ORDER — MIDAZOLAM HCL 5 MG/5ML IJ SOLN
INTRAMUSCULAR | Status: DC | PRN
  Administered 2021-11-26: 2 mg via INTRAVENOUS

## 2021-11-26 MED ORDER — CEFAZOLIN SODIUM-DEXTROSE 2-4 GM/100ML-% IV SOLN
2.0000 g | INTRAVENOUS | Status: AC
  Administered 2021-11-26: 2 g via INTRAVENOUS

## 2021-11-26 MED ORDER — CEFAZOLIN SODIUM-DEXTROSE 2-4 GM/100ML-% IV SOLN
INTRAVENOUS | Status: AC
  Filled 2021-11-26: qty 100

## 2021-11-26 MED ORDER — EPHEDRINE SULFATE 50 MG/ML IJ SOLN
INTRAMUSCULAR | Status: DC | PRN
  Administered 2021-11-26: 15 mg via INTRAVENOUS
  Administered 2021-11-26: 10 mg via INTRAVENOUS

## 2021-11-26 MED ORDER — SCOPOLAMINE 1 MG/3DAYS TD PT72
1.0000 | MEDICATED_PATCH | TRANSDERMAL | Status: DC
  Administered 2021-11-26: 1.5 mg via TRANSDERMAL

## 2021-11-26 MED ORDER — ACETAMINOPHEN 500 MG PO TABS
1000.0000 mg | ORAL_TABLET | Freq: Once | ORAL | Status: AC
  Administered 2021-11-26: 1000 mg via ORAL

## 2021-11-26 SURGICAL SUPPLY — 46 items
APL PRP STRL LF DISP 70% ISPRP (MISCELLANEOUS) ×1
APL SKNCLS STERI-STRIP NONHPOA (GAUZE/BANDAGES/DRESSINGS) ×1
APPLIER CLIP 9.375 MED OPEN (MISCELLANEOUS)
APR CLP MED 9.3 20 MLT OPN (MISCELLANEOUS)
BENZOIN TINCTURE PRP APPL 2/3 (GAUZE/BANDAGES/DRESSINGS) ×2 IMPLANT
BLADE HEX COATED 2.75 (ELECTRODE) ×2 IMPLANT
BLADE SURG 15 STRL LF DISP TIS (BLADE) ×1 IMPLANT
BLADE SURG 15 STRL SS (BLADE) ×2
CANISTER SUC SOCK COL 7IN (MISCELLANEOUS) IMPLANT
CANISTER SUCT 1200ML W/VALVE (MISCELLANEOUS) IMPLANT
CHLORAPREP W/TINT 26 (MISCELLANEOUS) ×2 IMPLANT
CLIP APPLIE 9.375 MED OPEN (MISCELLANEOUS) ×1 IMPLANT
COVER BACK TABLE 60X90IN (DRAPES) ×2 IMPLANT
COVER MAYO STAND STRL (DRAPES) ×2 IMPLANT
COVER PROBE W GEL 5X96 (DRAPES) ×2 IMPLANT
DRAPE LAPAROTOMY 100X72 PEDS (DRAPES) ×2 IMPLANT
DRAPE UTILITY XL STRL (DRAPES) ×2 IMPLANT
DRSG TEGADERM 4X4.75 (GAUZE/BANDAGES/DRESSINGS) ×2 IMPLANT
ELECT REM PT RETURN 9FT ADLT (ELECTROSURGICAL) ×2
ELECTRODE REM PT RTRN 9FT ADLT (ELECTROSURGICAL) ×1 IMPLANT
GAUZE SPONGE 4X4 12PLY STRL LF (GAUZE/BANDAGES/DRESSINGS) IMPLANT
GLOVE SURG ENC MOIS LTX SZ7 (GLOVE) ×2 IMPLANT
GLOVE SURG UNDER POLY LF SZ6.5 (GLOVE) ×1 IMPLANT
GLOVE SURG UNDER POLY LF SZ7 (GLOVE) ×1 IMPLANT
GLOVE SURG UNDER POLY LF SZ7.5 (GLOVE) ×2 IMPLANT
GOWN STRL REUS W/ TWL LRG LVL3 (GOWN DISPOSABLE) ×2 IMPLANT
GOWN STRL REUS W/TWL LRG LVL3 (GOWN DISPOSABLE) ×4
KIT MARKER MARGIN INK (KITS) ×2 IMPLANT
NDL HYPO 25X1 1.5 SAFETY (NEEDLE) ×1 IMPLANT
NEEDLE HYPO 25X1 1.5 SAFETY (NEEDLE) ×2 IMPLANT
NS IRRIG 1000ML POUR BTL (IV SOLUTION) ×2 IMPLANT
PACK BASIN DAY SURGERY FS (CUSTOM PROCEDURE TRAY) ×2 IMPLANT
PENCIL SMOKE EVACUATOR (MISCELLANEOUS) ×2 IMPLANT
SLEEVE SCD COMPRESS KNEE MED (STOCKING) ×2 IMPLANT
SPONGE T-LAP 4X18 ~~LOC~~+RFID (SPONGE) ×2 IMPLANT
STRIP CLOSURE SKIN 1/2X4 (GAUZE/BANDAGES/DRESSINGS) ×2 IMPLANT
SUT MON AB 4-0 PC3 18 (SUTURE) ×2 IMPLANT
SUT SILK 2 0 SH (SUTURE) IMPLANT
SUT VIC AB 3-0 SH 27 (SUTURE) ×2
SUT VIC AB 3-0 SH 27X BRD (SUTURE) ×1 IMPLANT
SYR BULB EAR ULCER 3OZ GRN STR (SYRINGE) IMPLANT
SYR CONTROL 10ML LL (SYRINGE) ×2 IMPLANT
TOWEL GREEN STERILE FF (TOWEL DISPOSABLE) ×2 IMPLANT
TRAY FAXITRON CT DISP (TRAY / TRAY PROCEDURE) ×3 IMPLANT
TUBE CONNECTING 20X1/4 (TUBING) IMPLANT
YANKAUER SUCT BULB TIP NO VENT (SUCTIONS) IMPLANT

## 2021-11-26 NOTE — H&P (Signed)
History of Present Illness  The patient is a 50 year old female who presents with a breast mass. Referred by Dr. Truitt Merle for right breast papillomas   PCP -Dr. Alysia Penna GYN - Dr. Josefa Half   This is a 50 year old female with a family history of breast cancer in her mother (premenopausal at age 50), paternal grandmother, and paternal aunt who was seen for her high risk for breast cancer.  Her mother underwent genetic testing and is negative for BRCA1 and 2.  She underwent screening MRI in April of this year.  This showed a 12 mm linear focus of enhancement in the lower central right breast as well as a 5 mm area in the superior right breast.  She underwent biopsy of the 5 mm area in the upper right breast which revealed the diagnosis of fibrocystic changes with usual ductal hyperplasia.  The 1.2 cm area of enhancement was not visualized at the time of biopsy.  Subsequently, she underwent a six-month follow-up MRI which showed a new 0.5 cm area in the upper central right breast as well as the 1.2 cm area in the lower central right breast.  The 1.2 cm area was subsequently biopsied under MRI guidance and revealed the diagnosis of ductal papilloma with fibrocystic changes.  A dumbbell-shaped marker was placed at this area.  Subsequently, she underwent another MRI guided biopsy of the 0.5 cm mass in the upper inner quadrant of the right breast.  A Q-shaped clip was placed here.  This biopsy revealed diagnosis of intraductal papilloma.  The patient has been quite nervous about these and comes in today for her first consultation.  She states that she did have genetic testing sent last week but is still waiting on the results.     CLINICAL DATA:  Status post MRI guided biopsy of a 0.5 cm mass within the upper inner quadrant the RIGHT breast   EXAM: DIAGNOSTIC RIGHT MAMMOGRAM POST MRI BIOPSY   COMPARISON:  Previous exam(s).   FINDINGS: Mammographic images were obtained following MRI guided biopsy of  0.5 cm mass within the upper central RIGHT breast (upper inner quadrant. The Q shaped biopsy marking clip is in expected position at the site of biopsy.   IMPRESSION: 1. Appropriate positioning of the Q shaped biopsy marking clip at the site of biopsy in the upper inner quadrant of the RIGHT breast corresponding to the site of the targeted 0.5 cm mass on MRI guided biopsy today. 2. The cylinder shaped clip placed at the time of an earlier benign breast biopsy was removed during today's biopsy. 3. Dumbbell-shaped clip within the slightly lower outer RIGHT breast corresponds to recent biopsy-proven papilloma within the RIGHT breast.   Final Assessment: Post Procedure Mammograms for Marker Placement     Electronically Signed  By: [name] M.D.  On: 12/09/2020 10:37     Problem List/Past Medical  INTRADUCTAL PAPILLOMA OF RIGHT BREAST (D24.1)    Past Surgical History  Breast Biopsy  Bilateral. multiple Oral Surgery  Spinal Surgery - Lower Back    Diagnostic Studies History  Colonoscopy  never Mammogram  within last year Pap Smear  1-5 years ago   Allergies  Phenergan *ANTIHISTAMINES*  Allergies Reconciled    Medication History  traMADol HCl  (50MG Tablet, Oral) Active. Zolpidem Tartrate  (10MG Tablet, Oral) Active. Vitamin D (Ergocalciferol)  (1.25 MG(50000 UT) Capsule, Oral) Active. Dicyclomine HCl  (20MG Tablet, Oral) Active. Multivitamin  (Oral) Active. Glucosamine Chondroitin Complx  (Oral)  Active. Medications Reconciled    Social History  Alcohol use  Occasional alcohol use. Caffeine use  Coffee, Tea. No drug use  Tobacco use  Never smoker.   Family History  Anesthetic complications  Mother. Arthritis  Mother. Breast Cancer  Mother. Cerebrovascular Accident  Mother. Diabetes Mellitus  Mother. Heart Disease  Mother. Heart disease in female family member before age 53  Heart disease in female family member before age 83  Hypertension  Father,  Mother. Kidney Disease  Mother.   Pregnancy / Birth History  Age at menarche  59 years. Contraceptive History  Oral contraceptives. Gravida  3 Irregular periods  Length (months) of breastfeeding  12-24 Maternal age  33-20 Para  3   Other Problems  Anxiety Disorder  Back Pain  Bladder Problems  Depression  Lump In Breast  Migraine Headache          Review of Systems  General Not Present- Appetite Loss, Chills, Fatigue, Fever, Night Sweats, Weight Gain and Weight Loss. Skin Not Present- Change in Wart/Mole, Dryness, Hives, Jaundice, New Lesions, Non-Healing Wounds, Rash and Ulcer. HEENT Not Present- Earache, Hearing Loss, Hoarseness, Nose Bleed, Oral Ulcers, Ringing in the Ears, Seasonal Allergies, Sinus Pain, Sore Throat, Visual Disturbances, Wears glasses/contact lenses and Yellow Eyes. Respiratory Not Present- Bloody sputum, Chronic Cough, Difficulty Breathing, Snoring and Wheezing. Breast Present- Breast Mass. Not Present- Breast Pain, Nipple Discharge and Skin Changes. Cardiovascular Not Present- Chest Pain, Difficulty Breathing Lying Down, Leg Cramps, Palpitations, Rapid Heart Rate, Shortness of Breath and Swelling of Extremities. Gastrointestinal Not Present- Abdominal Pain, Bloating, Bloody Stool, Change in Bowel Habits, Chronic diarrhea, Constipation, Difficulty Swallowing, Excessive gas, Gets full quickly at meals, Hemorrhoids, Indigestion, Nausea, Rectal Pain and Vomiting. Female Genitourinary Not Present- Frequency, Nocturia, Painful Urination, Pelvic Pain and Urgency. Musculoskeletal Not Present- Back Pain, Joint Pain, Joint Stiffness, Muscle Pain, Muscle Weakness and Swelling of Extremities. Neurological Not Present- Decreased Memory, Fainting, Headaches, Numbness, Seizures, Tingling, Tremor, Trouble walking and Weakness. Psychiatric Present- Anxiety. Not Present- Bipolar, Change in Sleep Pattern, Depression, Fearful and Frequent crying. Endocrine Not Present- Cold  Intolerance, Excessive Hunger, Hair Changes, Heat Intolerance, Hot flashes and New Diabetes. Hematology Not Present- Blood Thinners, Easy Bruising, Excessive bleeding, Gland problems, HIV and Persistent Infections.   Vitals  Weight: 223.25 lb   Height: 67 in  Body Surface Area: 2.12 m   Body Mass Index: 34.97 kg/m   Temp.: 98.1 F    Pulse: 100 (Regular)    BP: 126/72(Sitting, Left Arm, Standard)               Physical Exam    The physical exam findings are as follows: Note: Constitutional: WDWN in NAD, conversant, no obvious deformities; resting comfortably Eyes: Pupils equal, round; sclera anicteric; moist conjunctiva; no lid lag HENT: Oral mucosa moist; good dentition Neck: No masses palpated, trachea midline; no thyromegaly Lungs: CTA bilaterally; normal respiratory effort Breasts: Symmetric, no nipple retraction or discharge, no axillary lymphadenopathy on either side, no palpable masses in either breast. CV: Regular rate and rhythm; no murmurs; extremities well-perfused with no edema Abd: +bowel sounds, soft, non-tender, no palpable organomegaly; no palpable hernias Musc: Normal gait; no apparent clubbing or cyanosis in extremities Lymphatic: No palpable cervical or axillary lymphadenopathy Skin: Warm, dry; no sign of jaundice Psychiatric - alert and oriented x 4; calm mood and affect       Assessment & Plan    INTRADUCTAL PAPILLOMA OF RIGHT BREAST (D24.1) Impression: x 2   Current  Plans Schedule for Surgery - Right radioactive seed localized lumpectomy x 2.  The surgical procedure has been discussed with the patient.  Potential risks, benefits, alternative treatments, and expected outcomes have been explained.  All of the patient's questions at this time have been answered.  The likelihood of reaching the patient's treatment goal is good.  The patient understand the proposed surgical procedure and wishes to proceed.   Imogene Burn. Georgette Dover, MD, [facility]  Surgery  General Surgery   11/26/2021 8:02 AM

## 2021-11-26 NOTE — Anesthesia Postprocedure Evaluation (Signed)
Anesthesia Post Note  Patient: Stacy Hays  Procedure(s) Performed: RIGHT BREAST LUMPECTOMY WITH RADIOACTIVE SEED LOCALIZATION X2 (Right: Breast)     Patient location during evaluation: Phase II Anesthesia Type: General Level of consciousness: awake and alert, patient cooperative and oriented Pain management: pain level controlled Vital Signs Assessment: post-procedure vital signs reviewed and stable Respiratory status: spontaneous breathing, nonlabored ventilation and respiratory function stable Cardiovascular status: blood pressure returned to baseline and stable Postop Assessment: no apparent nausea or vomiting and able to ambulate Anesthetic complications: no   No notable events documented.  Last Vitals:  Vitals:   11/26/21 0945 11/26/21 0950  BP: 119/79 122/82  Pulse: 85 88  Resp: (!) 9 16  Temp:  36.7 C  SpO2: 100% 97%    Last Pain:  Vitals:   11/26/21 0950  TempSrc:   PainSc: 1                  Taron Mondor,E. Zalyn Amend

## 2021-11-26 NOTE — Anesthesia Procedure Notes (Signed)
Procedure Name: Intubation Date/Time: 11/26/2021 8:26 AM Performed by: Tawni Millers, CRNA Pre-anesthesia Checklist: Patient identified, Emergency Drugs available, Suction available and Patient being monitored Patient Re-evaluated:Patient Re-evaluated prior to induction Oxygen Delivery Method: Circle system utilized Preoxygenation: Pre-oxygenation with 100% oxygen Induction Type: IV induction Ventilation: Mask ventilation without difficulty LMA: LMA inserted LMA Size: 4.0 Tube type: Oral Number of attempts: 1 Airway Equipment and Method: Oral airway Placement Confirmation: ETT inserted through vocal cords under direct vision, positive ETCO2 and breath sounds checked- equal and bilateral Tube secured with: Tape Dental Injury: Teeth and Oropharynx as per pre-operative assessment

## 2021-11-26 NOTE — Discharge Instructions (Addendum)
[place] Office Phone Number [phone]  BREAST BIOPSY/ PARTIAL MASTECTOMY: POST OP INSTRUCTIONS  Always review your discharge instruction sheet given to you by the facility where your surgery was performed.  IF YOU HAVE DISABILITY OR FAMILY LEAVE FORMS, YOU MUST BRING THEM TO THE OFFICE FOR PROCESSING.  DO NOT GIVE THEM TO YOUR DOCTOR.  A prescription for pain medication may be given to you upon discharge.  Take your pain medication as prescribed, if needed.  If narcotic pain medicine is not needed, then you may take acetaminophen (Tylenol) or ibuprofen (Advil) as needed. Take your usually prescribed medications unless otherwise directed If you need a refill on your pain medication, please contact your pharmacy.  They will contact our office to request authorization.  Prescriptions will not be filled after 5pm or on week-ends. You should eat very light the first 24 hours after surgery, such as soup, crackers, pudding, etc.  Resume your normal diet the day after surgery. Most patients will experience some swelling and bruising in the breast.  Ice packs and a good support bra will help.  Swelling and bruising can take several days to resolve.  It is common to experience some constipation if taking pain medication after surgery.  Increasing fluid intake and taking a stool softener will usually help or prevent this problem from occurring.  A mild laxative (Milk of Magnesia or Miralax) should be taken according to package directions if there are no bowel movements after 48 hours. Unless discharge instructions indicate otherwise, you may remove your bandages 24-48 hours after surgery, and you may shower at that time.  You may have steri-strips (small skin tapes) in place directly over the incision.  These strips should be left on the skin for 7-10 days.  If your surgeon used skin glue on the incision, you may shower in 24 hours.  The glue will flake off over the next 2-3 weeks.  Any  sutures or staples will be removed at the office during your follow-up visit. ACTIVITIES:  You may resume regular daily activities (gradually increasing) beginning the next day.  Wearing a good support bra or sports bra minimizes pain and swelling.  You may have sexual intercourse when it is comfortable. You may drive when you no longer are taking prescription pain medication, you can comfortably wear a seatbelt, and you can safely maneuver your car and apply brakes. RETURN TO WORK:  ______________________________________________________________________________________ Dennis Bast should see your doctor in the office for a follow-up appointment approximately two weeks after your surgery.  Your doctor's nurse will typically make your follow-up appointment when she calls you with your pathology report.  Expect your pathology report 2-3 business days after your surgery.  You may call to check if you do not hear from [place] after three days. OTHER INSTRUCTIONS: _______________________________________________________________________________________________ _____________________________________________________________________________________________________________________________________ _____________________________________________________________________________________________________________________________________ _____________________________________________________________________________________________________________________________________  WHEN TO CALL YOUR DOCTOR: Fever over 101.0 Nausea and/or vomiting. Extreme swelling or bruising. Continued bleeding from incision. Increased pain, redness, or drainage from the incision.  The clinic staff is available to answer your questions during regular business hours.  Please don't hesitate to call and ask to speak to one of the nurses for clinical concerns.  If you have a medical emergency, go to the nearest emergency room or call 911.  A surgeon from [facility] is always on call at the hospital.  For further questions, please visit [email or web address]   Post Anesthesia Home Care Instructions  Activity: Get plenty of rest for the remainder of the  day. A responsible individual must stay with you for 24 hours following the procedure.  For the next 24 hours, DO NOT: -Drive a car -Paediatric nurse -Drink alcoholic beverages -Take any medication unless instructed by your physician -Make any legal decisions or sign important papers.  Meals: Start with liquid foods such as gelatin or soup. Progress to regular foods as tolerated. Avoid greasy, spicy, heavy foods. If nausea and/or vomiting occur, drink only clear liquids until the nausea and/or vomiting subsides. Call your physician if vomiting continues.  Special Instructions/Symptoms: Your throat may feel dry or sore from the anesthesia or the breathing tube placed in your throat during surgery. If this causes discomfort, gargle with warm salt water. The discomfort should disappear within 24 hours.  If you had a scopolamine patch placed behind your ear for the management of post- operative nausea and/or vomiting:  1. The medication in the patch is effective for 72 hours, after which it should be removed.  Wrap patch in a tissue and discard in the trash. Wash hands thoroughly with soap and water. 2. You may remove the patch earlier than 72 hours if you experience unpleasant side effects which may include dry mouth, dizziness or visual disturbances. 3. Avoid touching the patch. Wash your hands with soap and water after contact with the patch.  May take Tylenol after 1pm today 11/26/21.

## 2021-11-26 NOTE — Op Note (Signed)
Pre-op Diagnosis:  Intraductal papilloma x 2 of the right breast Post-op Diagnosis: same Procedure:  Right radioactive seed localized lumpectomy x 2 Surgeon:  Maia Petties. Anesthesia:  GEN - LMA Indications:  This is a 50 year old female with a family history of breast cancer in her mother (premenopausal at age 43), paternal grandmother, and paternal aunt who was seen for her high risk for breast cancer.  Her mother underwent genetic testing and is negative for BRCA1 and 2.  She underwent screening MRI in April of this year.  This showed a 12 mm linear focus of enhancement in the lower central right breast as well as a 5 mm area in the superior right breast.  She underwent biopsy of the 5 mm area in the upper right breast which revealed the diagnosis of fibrocystic changes with usual ductal hyperplasia.  The 1.2 cm area of enhancement was not visualized at the time of biopsy.  Subsequently, she underwent a six-month follow-up MRI which showed a new 0.5 cm area in the upper central right breast as well as the 1.2 cm area in the lower central right breast.  The 1.2 cm area was subsequently biopsied under MRI guidance and revealed the diagnosis of ductal papilloma with fibrocystic changes.  A dumbbell-shaped marker was placed at this area.  Subsequently, she underwent another MRI guided biopsy of the 0.5 cm mass in the upper inner quadrant of the right breast.  A Q-shaped clip was placed here.  This biopsy revealed diagnosis of intraductal papilloma.  The patient has been quite nervous about these and comes in today for lumpectomy x 2.   Description of procedure: The patient is brought to the operating room placed in supine position on the operating room table. After an adequate level of general anesthesia was obtained, her right breast was prepped with ChloraPrep and draped in sterile fashion. A timeout was taken to ensure the proper patient and proper procedure. We interrogated the breast with the  neoprobe. We identified two areas of activity - one in the upper inner quadrant and another one just lateral to the areola.  We made a circumareolar incision around the upper half of the nipple after infiltrating with 0.25% Marcaine. Dissection was carried down in the breast tissue with cautery.   We began in the upper inner quadrant.  We used the neoprobe to guide [place] towards the radioactive seed. We excised an area of tissue around the radioactive seed two cm in diameter. The specimen was removed and was oriented with a paint kit. Specimen mammogram showed the radioactive seed as well as the biopsy clip within the specimen. This was sent for pathologic examination.   We then turned towards the more lateral seed.  We used the neoprobe to guide [place] towards the radioactive seed. We excised an area of tissue around the radioactive seed 2 cm in diameter. The specimen was removed and was oriented with a paint kit. Specimen mammogram showed the radioactive seed as well as the biopsy clip within the specimen. This was sent for pathologic examination. There is no residual radioactivity within the biopsy cavity. We inspected carefully for hemostasis. The wound was thoroughly irrigated. The wound was closed with a deep layer of 3-0 Vicryl and a subcuticular layer of 4-0 Monocryl. Benzoin Steri-Strips were applied. The patient was then extubated and brought to the recovery room in stable condition. All sponge, instrument, and needle counts are correct.  Imogene Burn. Georgette Dover, MD, [facility]  General/ Trauma Surgery  11/26/2021 9:14 AM

## 2021-11-26 NOTE — Transfer of Care (Signed)
Immediate Anesthesia Transfer of Care Note  Patient: Stacy Hays  Procedure(s) Performed: RIGHT BREAST LUMPECTOMY WITH RADIOACTIVE SEED LOCALIZATION X2 (Right: Breast)  Patient Location: PACU  Anesthesia Type:General  Level of Consciousness: sedated  Airway & Oxygen Therapy: Patient Spontanous Breathing and Patient connected to face mask oxygen  Post-op Assessment: Report given to RN and Post -op Vital signs reviewed and stable  Post vital signs: Reviewed and stable  Last Vitals:  Vitals Value Taken Time  BP 105/65 11/26/21 0917  Temp    Pulse 76 11/26/21 0918  Resp    SpO2 99 % 11/26/21 0918  Vitals shown include unvalidated device data.  Last Pain:  Vitals:   11/26/21 0706  TempSrc: Oral  PainSc: 0-No pain         Complications: No notable events documented.

## 2021-11-27 ENCOUNTER — Encounter (HOSPITAL_COMMUNITY)
Admission: AD | Disposition: A | Discharge status: Home / Self Care | Payer: Self-pay | Source: Ambulatory Visit | Attending: General Surgery

## 2021-11-27 ENCOUNTER — Ambulatory Visit (HOSPITAL_COMMUNITY): Payer: 59 | Admitting: Anesthesiology

## 2021-11-27 ENCOUNTER — Ambulatory Visit (HOSPITAL_COMMUNITY)
Admission: AD | Admit: 2021-11-27 | Discharge: 2021-11-27 | Disposition: A | Discharge status: Home / Self Care | Payer: 59 | Source: Ambulatory Visit | Attending: General Surgery | Admitting: General Surgery

## 2021-11-27 ENCOUNTER — Encounter (HOSPITAL_BASED_OUTPATIENT_CLINIC_OR_DEPARTMENT_OTHER): Payer: Self-pay | Admitting: Surgery

## 2021-11-27 ENCOUNTER — Other Ambulatory Visit: Payer: Self-pay | Admitting: Student

## 2021-11-27 DIAGNOSIS — N6489 Other specified disorders of breast: Secondary | ICD-10-CM | POA: Diagnosis present

## 2021-11-27 DIAGNOSIS — L7632 Postprocedural hematoma of skin and subcutaneous tissue following other procedure: Secondary | ICD-10-CM | POA: Insufficient documentation

## 2021-11-27 DIAGNOSIS — Y838 Other surgical procedures as the cause of abnormal reaction of the patient, or of later complication, without mention of misadventure at the time of the procedure: Secondary | ICD-10-CM | POA: Insufficient documentation

## 2021-11-27 HISTORY — PX: HEMATOMA EVACUATION: SHX5118

## 2021-11-27 LAB — BASIC METABOLIC PANEL
Anion gap: 5 (ref 5–15)
BUN: 19 mg/dL (ref 6–20)
CO2: 24 mmol/L (ref 22–32)
Calcium: 8.2 mg/dL — ABNORMAL LOW (ref 8.9–10.3)
Chloride: 106 mmol/L (ref 98–111)
Creatinine, Ser: 1.01 mg/dL — ABNORMAL HIGH (ref 0.44–1.00)
GFR, Estimated: >60 mL/min (ref >60)
Glucose, Bld: 113 mg/dL — ABNORMAL HIGH (ref 70–99)
Potassium: 4.4 mmol/L (ref 3.5–5.1)
Sodium: 135 mmol/L (ref 135–145)

## 2021-11-27 LAB — CBC
HCT: 33.1 % — ABNORMAL LOW (ref 36.0–46.0)
Hemoglobin: 10.6 g/dL — ABNORMAL LOW (ref 12.0–15.0)
MCH: 29.9 pg (ref 26.0–34.0)
MCHC: 32 g/dL (ref 30.0–36.0)
MCV: 93.5 fL (ref 80.0–100.0)
Platelets: 338 10*3/uL (ref 150–400)
RBC: 3.54 MIL/uL — ABNORMAL LOW (ref 3.87–5.11)
RDW: 15.7 % — ABNORMAL HIGH (ref 11.5–15.5)
WBC: 10.7 10*3/uL — ABNORMAL HIGH (ref 4.0–10.5)
nRBC: 0 % (ref 0.0–0.2)

## 2021-11-27 LAB — SURGICAL PATHOLOGY

## 2021-11-27 SURGERY — EVACUATION HEMATOMA
Anesthesia: General | Site: Breast | Laterality: Right

## 2021-11-27 MED ORDER — FENTANYL CITRATE (PF) 100 MCG/2ML IJ SOLN
INTRAMUSCULAR | Status: AC
  Filled 2021-11-27: qty 2

## 2021-11-27 MED ORDER — LACTATED RINGERS IV SOLN: INTRAVENOUS | Status: DC

## 2021-11-27 MED ORDER — MIDAZOLAM HCL 2 MG/2ML IJ SOLN
INTRAMUSCULAR | Status: AC
  Filled 2021-11-27: qty 2

## 2021-11-27 MED ORDER — AMISULPRIDE (ANTIEMETIC) 5 MG/2ML IV SOLN: 10.0000 mg | Freq: Once | INTRAVENOUS | Status: DC | PRN

## 2021-11-27 MED ORDER — LIDOCAINE 2% (20 MG/ML) 5 ML SYRINGE
INTRAMUSCULAR | Status: DC | PRN
  Administered 2021-11-27: 100 mg via INTRAVENOUS

## 2021-11-27 MED ORDER — ACETAMINOPHEN 500 MG PO TABS
500.0000 mg | ORAL_TABLET | Freq: Once | ORAL | Status: AC
  Administered 2021-11-27: 500 mg via ORAL
  Filled 2021-11-27: qty 1

## 2021-11-27 MED ORDER — BUPIVACAINE-EPINEPHRINE (PF) 0.25% -1:200000 IJ SOLN
INTRAMUSCULAR | Status: AC
  Filled 2021-11-27: qty 30

## 2021-11-27 MED ORDER — SODIUM CHLORIDE 0.9 % IR SOLN
Status: DC | PRN
  Administered 2021-11-27: 1000 mL

## 2021-11-27 MED ORDER — FENTANYL CITRATE (PF) 100 MCG/2ML IJ SOLN
INTRAMUSCULAR | Status: DC | PRN
  Administered 2021-11-27: 100 ug via INTRAVENOUS
  Administered 2021-11-27 (×2): 50 ug via INTRAVENOUS

## 2021-11-27 MED ORDER — LIDOCAINE HCL (PF) 1 % IJ SOLN
INTRAMUSCULAR | Status: AC
  Filled 2021-11-27: qty 30

## 2021-11-27 MED ORDER — ONDANSETRON HCL 4 MG/2ML IJ SOLN
INTRAMUSCULAR | Status: DC | PRN
  Administered 2021-11-27: 4 mg via INTRAVENOUS

## 2021-11-27 MED ORDER — CHLORHEXIDINE GLUCONATE 0.12 % MT SOLN
15.0000 mL | Freq: Once | OROMUCOSAL | Status: AC
  Administered 2021-11-27: 15 mL via OROMUCOSAL

## 2021-11-27 MED ORDER — HYDROCODONE-ACETAMINOPHEN 5-325 MG PO TABS: 1.0000 | ORAL_TABLET | Freq: Four times a day (QID) | ORAL | 0 refills | Status: DC | PRN

## 2021-11-27 MED ORDER — CEFAZOLIN SODIUM-DEXTROSE 2-4 GM/100ML-% IV SOLN
2.0000 g | INTRAVENOUS | Status: AC
  Administered 2021-11-27: 2 g via INTRAVENOUS
  Filled 2021-11-27: qty 100

## 2021-11-27 MED ORDER — LIDOCAINE HCL (PF) 2 % IJ SOLN
INTRAMUSCULAR | Status: AC
  Filled 2021-11-27: qty 5

## 2021-11-27 MED ORDER — PROPOFOL 10 MG/ML IV BOLUS
INTRAVENOUS | Status: DC | PRN
  Administered 2021-11-27: 200 mg via INTRAVENOUS

## 2021-11-27 MED ORDER — ONDANSETRON HCL 4 MG/2ML IJ SOLN
INTRAMUSCULAR | Status: AC
  Filled 2021-11-27: qty 2

## 2021-11-27 MED ORDER — FENTANYL CITRATE PF 50 MCG/ML IJ SOSY: 25.0000 ug | PREFILLED_SYRINGE | INTRAMUSCULAR | Status: DC | PRN

## 2021-11-27 MED ORDER — DEXAMETHASONE SODIUM PHOSPHATE 10 MG/ML IJ SOLN
INTRAMUSCULAR | Status: AC
  Filled 2021-11-27: qty 1

## 2021-11-27 MED ORDER — MIDAZOLAM HCL 5 MG/5ML IJ SOLN
INTRAMUSCULAR | Status: DC | PRN
Start: 2021-11-27 — End: 2021-11-27
  Administered 2021-11-27: 2 mg via INTRAVENOUS

## 2021-11-27 MED ORDER — DEXAMETHASONE SODIUM PHOSPHATE 10 MG/ML IJ SOLN
INTRAMUSCULAR | Status: DC | PRN
  Administered 2021-11-27: 10 mg via INTRAVENOUS

## 2021-11-27 MED ORDER — PROPOFOL 10 MG/ML IV BOLUS
INTRAVENOUS | Status: AC
  Filled 2021-11-27: qty 20

## 2021-11-27 SURGICAL SUPPLY — 46 items
ADH SKN CLS APL DERMABOND .7 (GAUZE/BANDAGES/DRESSINGS) ×1
APL PRP STRL LF DISP 70% ISPRP (MISCELLANEOUS) ×1
APL SKNCLS STERI-STRIP NONHPOA (GAUZE/BANDAGES/DRESSINGS)
BAG COUNTER SPONGE SURGICOUNT (BAG) ×1 IMPLANT
BAG SPNG CNTER NS LX DISP (BAG) ×1
BENZOIN TINCTURE PRP APPL 2/3 (GAUZE/BANDAGES/DRESSINGS) IMPLANT
BINDER BREAST XXLRG (GAUZE/BANDAGES/DRESSINGS) ×1 IMPLANT
BLADE HEX COATED 2.75 (ELECTRODE) ×2 IMPLANT
BLADE SURG SZ10 CARB STEEL (BLADE) ×3 IMPLANT
CHLORAPREP W/TINT 26 (MISCELLANEOUS) ×1 IMPLANT
DECANTER SPIKE VIAL GLASS SM (MISCELLANEOUS) IMPLANT
DERMABOND ADVANCED (GAUZE/BANDAGES/DRESSINGS) ×1
DERMABOND ADVANCED .7 DNX12 (GAUZE/BANDAGES/DRESSINGS) IMPLANT
DRAPE LAPAROTOMY T 102X78X121 (DRAPES) ×1 IMPLANT
DRAPE LAPAROTOMY TRNSV 102X78 (DRAPES) IMPLANT
DRAPE SHEET LG 3/4 BI-LAMINATE (DRAPES) IMPLANT
DRAPE UTILITY XL STRL (DRAPES) ×1 IMPLANT
ELECT PENCIL ROCKER SW 15FT (MISCELLANEOUS) ×1 IMPLANT
ELECT REM PT RETURN 15FT ADLT (MISCELLANEOUS) ×2 IMPLANT
EVACUATOR SILICONE 100CC (DRAIN) IMPLANT
GAUZE SPONGE 4X4 12PLY STRL (GAUZE/BANDAGES/DRESSINGS) ×2 IMPLANT
GLOVE SURG ENC MOIS LTX SZ7.5 (GLOVE) ×4 IMPLANT
GLOVE SURG UNDER POLY LF SZ7 (GLOVE) ×2 IMPLANT
GOWN STRL REUS W/ TWL XL LVL3 (GOWN DISPOSABLE) ×1 IMPLANT
GOWN STRL REUS W/TWL LRG LVL3 (GOWN DISPOSABLE) ×2 IMPLANT
GOWN STRL REUS W/TWL XL LVL3 (GOWN DISPOSABLE) ×4 IMPLANT
HEMOSTAT ARISTA ABSORB 3G PWDR (HEMOSTASIS) ×1 IMPLANT
KIT BASIN OR (CUSTOM PROCEDURE TRAY) ×2 IMPLANT
KIT TURNOVER KIT A (KITS) IMPLANT
MARKER SKIN DUAL TIP RULER LAB (MISCELLANEOUS) ×1 IMPLANT
NDL HYPO 25X1 1.5 SAFETY (NEEDLE) ×1 IMPLANT
NEEDLE HYPO 25X1 1.5 SAFETY (NEEDLE) ×2 IMPLANT
NS IRRIG 1000ML POUR BTL (IV SOLUTION) ×2 IMPLANT
PACK BASIC VI WITH GOWN DISP (CUSTOM PROCEDURE TRAY) ×2 IMPLANT
PENCIL SMOKE EVACUATOR (MISCELLANEOUS) IMPLANT
SOL PREP POV-IOD 4OZ 10% (MISCELLANEOUS) ×1 IMPLANT
SPONGE T-LAP 18X18 ~~LOC~~+RFID (SPONGE) ×1 IMPLANT
SPONGE T-LAP 4X18 ~~LOC~~+RFID (SPONGE) ×2 IMPLANT
STAPLER VISISTAT 35W (STAPLE) IMPLANT
SUT MNCRL AB 4-0 PS2 18 (SUTURE) ×1 IMPLANT
SUT VIC AB 3-0 SH 18 (SUTURE) ×1 IMPLANT
SYR BULB EAR ULCER 3OZ GRN STR (SYRINGE) ×1 IMPLANT
SYR CONTROL 10ML LL (SYRINGE) ×2 IMPLANT
TOWEL OR 17X26 10 PK STRL BLUE (TOWEL DISPOSABLE) ×2 IMPLANT
WATER STERILE IRR 1000ML POUR (IV SOLUTION) ×1 IMPLANT
YANKAUER SUCT BULB TIP 10FT TU (MISCELLANEOUS) ×1 IMPLANT

## 2021-11-27 NOTE — Op Note (Signed)
11/27/2021  7:45 PM  PATIENT:  Stacy Hays  50 y.o. female  PRE-OPERATIVE DIAGNOSIS:  HEMATOMA RIGHT BREAST  POST-OPERATIVE DIAGNOSIS:  HEMATOMA RIGHT BREAST  PROCEDURE:  Procedure(s) with comments: EVACUATION HEMATOMA RIGHT BREAST (Right) - 60 MINUTES ROOM 4  SURGEON:  Surgeon(s) and Role:    * Jovita Kussmaul, MD - Primary  PHYSICIAN ASSISTANT:   ASSISTANTS: none   ANESTHESIA:   general  EBL:  minimal   BLOOD ADMINISTERED:none  DRAINS: none   LOCAL MEDICATIONS USED:  NONE  SPECIMEN:  No Specimen  DISPOSITION OF SPECIMEN:  N/A  COUNTS:  YES  TOURNIQUET:  * No tourniquets in log *  DICTATION: .Dragon Dictation  After informed consent was obtained the patient was brought to the operating room and placed in the supine position on the operating table.  After adequate induction of general anesthesia the patient's right breast was prepped with ChloraPrep, allowed to dry, and draped in usual sterile manner.  An appropriate timeout was performed.  The patient had a previous lumpectomy a day ago and developed a large hematoma.  Her periareolar incision was reopened sharply with a 15 blade knife and electrocautery.  A large hematoma was evacuated.  The open wound was then irrigated with copious amounts of saline.  The walls of the lumpectomy cavity was corroborated with the electrocautery.  I never identified a distinct bleeding area but some of the surface was just generally oozy.  Once the area appeared to be hemostatic I then coated the lumpectomy cavity in [place].  The deep layer of the lumpectomy cavity was then closed with layers of interrupted 3-0 Vicryl stitches.  The skin was then closed with interrupted 4-0 Monocryl subcuticular stitches.  Dermabond dressings were applied.  The patient tolerated the procedure well.  At the end of the case all needle sponge and instrument counts were correct.  The patient was then awakened and taken to recovery in stable  condition.  PLAN OF CARE: Discharge to home after PACU  PATIENT DISPOSITION:  PACU - hemodynamically stable.   Delay start of Pharmacological VTE agent (>24hrs) due to surgical blood loss or risk of bleeding: not applicable

## 2021-11-27 NOTE — H&P (Addendum)
PROVIDER:  Basilia Jumbo, PA   MRN: [id number] DOB: [birth year] DATE OF ENCOUNTER: 11/27/2021 Interval History:    Stacy Hays is a 50 y.o. female with a family history of breast cancer in her mother (premenopausal at age 35), paternal grandmother, and paternal aunt who was seen for her high risk for breast cancer.  Her mother underwent genetic testing and is negative for BRCA1 and 2.  She underwent screening MRI in April of this year.  This showed a 12 mm linear focus of enhancement in the lower central right breast as well as a 5 mm area in the superior right breast.  She underwent biopsy of the 5 mm area in the upper right breast which revealed the diagnosis of fibrocystic changes with usual ductal hyperplasia.  The 1.2 cm area of enhancement was not visualized at the time of biopsy.  Subsequently, she underwent a six-month follow-up MRI which showed a new 0.5 cm area in the upper central right breast as well as the 1.2 cm area in the lower central right breast.  The 1.2 cm area was subsequently biopsied under MRI guidance and revealed the diagnosis of ductal papilloma with fibrocystic changes.  A dumbbell-shaped marker was placed at this area.  Subsequently, she underwent another MRI guided biopsy of the 0.5 cm mass in the upper inner quadrant of the right breast.  A Q-shaped clip was placed here.  This biopsy revealed diagnosis of intraductal papilloma.    Urgent office 11/27/21 She underwent right seed localized lumpectomy x2 for intraductal papilloma x2 yesterday, 11/26/2021 by Dr. Georgette Dover.  He removed both seeds through 1 incision.  She called the office today concerned with swelling of her right breast as well as tightness and pain.  She states she almost passed out in the shower earlier and does not feel well overall.  She feels like her breast is becoming heavier and larger as the day goes on.  She denies fever.  She is taking Tylenol and Motrin for pain which is well managed.   Pathology:     11/26/21 A. BREAST, RIGHT UPPER/INNER QUADRANT, LUMPECTOMY:  - Fibrocystic change with usual ductal hyperplasia and apocrine  metaplasia  - Biopsy site changes  - Negative for papilloma or carcinoma   B. BREAST, RIGHT LATERAL, LUMPECTOMY:  - Small intraductal papilloma  - Fibrocystic change with usual ductal hyperplasia  - Negative for carcinoma   Review of Systems:    ROS All other systems reviewed and are negative.   Medications:          Current Outpatient Medications on File Prior to Visit  Medication Sig Dispense Refill   buPROPion (WELLBUTRIN XL) 150 MG XL tablet Take 1 tablet (150 mg total) by mouth once daily       traMADoL (ULTRAM) 50 mg tablet TAKE 1 TABLET(50 MG) BY MOUTH EVERY 6 HOURS       zolpidem (AMBIEN) 10 mg tablet Take by mouth       alfalfa 650 mg Tab Take 1 tablet by mouth once daily       cholecalciferol, vitamin D3, (CHOLECALCIFEROL, VIT D3,,BULK,) 100,000 unit/gram Powd Take by mouth once daily        No current facility-administered medications on file prior to visit.      Physical Examination:    BP 110/70   Pulse (!) 114   Temp 36.6 C (97.9 F)   Ht 167.6 cm ('5\' 6"' )   Wt 93.8 kg (206 lb 12.8 oz)  SpO2 99%   BMI 33.38 kg/m  General: Well-developed, well-nourished.  Appears slightly pale. Wearing mask The right breast is very large compared to the left breast.  There is some ecchymosis around the areola.  The breast is heavy and tense.   Assessment and Plan:    Diagnoses and all orders for this visit:   Intraductal papilloma of breast, right   S/P lumpectomy, right breast   She is status post right breast lumpectomy x2 yesterday, 11/26/2021 by Dr. Georgette Dover.  She comes in today with concerns of increased size of right breast.  On exam, she appears to have a large hematoma.  Her vital signs are stable, pulse rechecked at 105, but she does appear slightly pale and complains of some lightheadedness.  We spoke to Dr. Georgette Dover who recommended  evacuation of the hematoma tonight.  Dr. Marlou Starks is on-call and agrees to take her to the operating room.  She will stay n.p.o. and will go to [place] short stay.   [name], PA-C  Large right breast hematoma 1 day after lumpectomy for papilloma. This will benefit for evacuation in the OR. I have discussed with her the risks and benefits of the surgery as well as some of the technical aspects and she understands and wishes to proceed.

## 2021-11-27 NOTE — Transfer of Care (Signed)
Immediate Anesthesia Transfer of Care Note  Patient: Stacy Hays  Procedure(s) Performed: EVACUATION HEMATOMA RIGHT BREAST (Right: Breast)  Patient Location: PACU  Anesthesia Type:General  Level of Consciousness: awake, alert  and oriented  Airway & Oxygen Therapy: Patient Spontanous Breathing and Patient connected to face mask oxygen  Post-op Assessment: Report given to RN and Post -op Vital signs reviewed and stable  Post vital signs: Reviewed and stable  Last Vitals:  Vitals Value Taken Time  BP    Temp    Pulse 107 11/27/21 1957  Resp 16 11/27/21 1957  SpO2 99 % 11/27/21 1957    Last Pain:  Vitals:   11/27/21 1709  TempSrc: Oral  PainSc: 5       Patients Stated Pain Goal: 2 (00/12/39 3594)  Complications: No notable events documented.

## 2021-11-27 NOTE — Anesthesia Preprocedure Evaluation (Addendum)
Anesthesia Evaluation  Patient identified by MRN, date of birth, ID band Patient awake    Reviewed: Allergy & Precautions, NPO status , Patient's Chart, lab work & pertinent test results  History of Anesthesia Complications Negative for: history of anesthetic complications  Airway Mallampati: I  TM Distance: >3 FB Neck ROM: Full    Dental no notable dental hx. (+) Dental Advisory Given   Pulmonary neg pulmonary ROS,    Pulmonary exam normal        Cardiovascular negative cardio ROS Normal cardiovascular exam  '18 stress: EF 60%, low risk   Neuro/Psych  Headaches, PSYCHIATRIC DISORDERS Anxiety Depression    GI/Hepatic Neg liver ROS, GERD  Controlled,  Endo/Other  0bese  Renal/GU negative Renal ROS     Musculoskeletal   Abdominal (+) + obese,   Peds  Hematology negative hematology ROS (+)   Anesthesia Other Findings   Reproductive/Obstetrics                            Anesthesia Physical  Anesthesia Plan  ASA: 2 and emergent  Anesthesia Plan: General   Post-op Pain Management: Minimal or no pain anticipated and Tylenol PO (pre-op)   Induction: Intravenous  PONV Risk Score and Plan: 3 and Ondansetron, Dexamethasone and Scopolamine patch - Pre-op  Airway Management Planned: LMA  Additional Equipment: None  Intra-op Plan:   Post-operative Plan: Extubation in OR  Informed Consent: I have reviewed the patients History and Physical, chart, labs and discussed the procedure including the risks, benefits and alternatives for the proposed anesthesia with the patient or authorized representative who has indicated his/her understanding and acceptance.     Dental advisory given  Plan Discussed with:   Anesthesia Plan Comments:        Anesthesia Quick Evaluation

## 2021-11-27 NOTE — Anesthesia Procedure Notes (Signed)
Procedure Name: LMA Insertion Date/Time: 11/27/2021 6:51 PM Performed by: Azuri Bozard D, CRNA Pre-anesthesia Checklist: Patient identified, Emergency Drugs available, Suction available and Patient being monitored Patient Re-evaluated:Patient Re-evaluated prior to induction Oxygen Delivery Method: Circle system utilized Preoxygenation: Pre-oxygenation with 100% oxygen Induction Type: IV induction Ventilation: Mask ventilation without difficulty LMA: LMA inserted LMA Size: 4.0 Tube type: Oral Number of attempts: 1 Placement Confirmation: positive ETCO2 and breath sounds checked- equal and bilateral Tube secured with: Tape Dental Injury: Teeth and Oropharynx as per pre-operative assessment

## 2021-11-27 NOTE — Interval H&P Note (Signed)
History and Physical Interval Note:  11/27/2021 6:42 PM  Stacy Hays  has presented today for surgery, with the diagnosis of HEMATOMA RIGHT BREAST.  The various methods of treatment have been discussed with the patient and family. After consideration of risks, benefits and other options for treatment, the patient has consented to  Procedure(s) with comments: EVACUATION HEMATOMA RIGHT BREAST (Right) - 60 MINUTES ROOM 4 as a surgical intervention.  The patient's history has been reviewed, patient examined, no change in status, stable for surgery.  I have reviewed the patient's chart and labs.  Questions were answered to the patient's satisfaction.     Autumn Messing III

## 2021-11-28 ENCOUNTER — Encounter (HOSPITAL_COMMUNITY): Payer: Self-pay | Admitting: General Surgery

## 2021-11-28 NOTE — Anesthesia Postprocedure Evaluation (Signed)
Anesthesia Post Note  Patient: Stacy Hays  Procedure(s) Performed: EVACUATION HEMATOMA RIGHT BREAST (Right: Breast)     Patient location during evaluation: PACU Anesthesia Type: General Level of consciousness: sedated Pain management: pain level controlled Vital Signs Assessment: post-procedure vital signs reviewed and stable Respiratory status: spontaneous breathing and respiratory function stable Cardiovascular status: stable Postop Assessment: no apparent nausea or vomiting Anesthetic complications: no   No notable events documented.  Last Vitals:  Vitals:   11/27/21 2000 11/27/21 2015  BP: 131/77 114/73  Pulse: 97 97  Resp: 13 14  Temp:  36.5 C  SpO2: 96% 97%    Last Pain:  Vitals:   11/27/21 2015  TempSrc:   PainSc: 0-No pain                 Mykelle Cockerell DANIEL

## 2021-11-28 NOTE — Telephone Encounter (Signed)
See message in med order highlighted box

## 2021-12-01 ENCOUNTER — Inpatient Hospital Stay: Payer: 59 | Admitting: Hematology

## 2021-12-01 ENCOUNTER — Telehealth: Payer: Self-pay | Admitting: Hematology

## 2021-12-01 ENCOUNTER — Telehealth: Payer: Self-pay

## 2021-12-01 NOTE — Telephone Encounter (Signed)
Scheduled per sch msg. Called and left msg  

## 2021-12-01 NOTE — Telephone Encounter (Signed)
Spoke with pt via telephone regarding her appt today with Dr. Burr Medico.  Pt had surgery on her breast last week which came back benign; therefore, Dr. Burr Medico wants to see her 3 months from now if the patient agrees.  Pt was agreeable to seeing Dr. Burr Medico in 3 months.  Transferred pt to scheduling to have her appt rescheduled and also sent a scheduling message asking to reschedule pt for follow-up with Dr Burr Medico in 3 months.

## 2021-12-10 ENCOUNTER — Other Ambulatory Visit: Payer: Self-pay

## 2021-12-24 HISTORY — PX: COLONOSCOPY: SHX174

## 2022-02-13 ENCOUNTER — Other Ambulatory Visit: Payer: Self-pay | Admitting: Family Medicine

## 2022-02-23 ENCOUNTER — Other Ambulatory Visit: Payer: Self-pay | Admitting: Family Medicine

## 2022-02-24 ENCOUNTER — Encounter (HOSPITAL_COMMUNITY): Payer: Self-pay

## 2022-03-02 ENCOUNTER — Other Ambulatory Visit: Payer: Self-pay

## 2022-03-02 ENCOUNTER — Encounter: Payer: Self-pay | Admitting: Hematology

## 2022-03-02 ENCOUNTER — Inpatient Hospital Stay: Payer: 59 | Attending: Hematology | Admitting: Hematology

## 2022-03-02 VITALS — BP 136/96 | HR 110 | Temp 98.6°F | Resp 16 | Ht 65.0 in | Wt 209.6 lb

## 2022-03-02 DIAGNOSIS — Z79899 Other long term (current) drug therapy: Secondary | ICD-10-CM | POA: Insufficient documentation

## 2022-03-02 DIAGNOSIS — Z1501 Genetic susceptibility to malignant neoplasm of breast: Secondary | ICD-10-CM | POA: Insufficient documentation

## 2022-03-02 DIAGNOSIS — N3281 Overactive bladder: Secondary | ICD-10-CM | POA: Insufficient documentation

## 2022-03-02 DIAGNOSIS — G8929 Other chronic pain: Secondary | ICD-10-CM | POA: Insufficient documentation

## 2022-03-02 DIAGNOSIS — G4700 Insomnia, unspecified: Secondary | ICD-10-CM | POA: Diagnosis not present

## 2022-03-02 DIAGNOSIS — M1990 Unspecified osteoarthritis, unspecified site: Secondary | ICD-10-CM | POA: Insufficient documentation

## 2022-03-02 DIAGNOSIS — N920 Excessive and frequent menstruation with regular cycle: Secondary | ICD-10-CM | POA: Diagnosis not present

## 2022-03-02 DIAGNOSIS — Z803 Family history of malignant neoplasm of breast: Secondary | ICD-10-CM | POA: Diagnosis present

## 2022-03-02 NOTE — Progress Notes (Signed)
Stacy Hays   [phone] [phone]   Clinic Follow up Note   Patient Care Team: Laurey Morale, MD as PCP - General Nunzio Cobbs, MD as Consulting Physician (Obstetrics and Gynecology) Donnie Mesa, MD as Consulting Physician (General Surgery) Truitt Merle, MD as Consulting Physician (Hematology)  Date of Service:  03/02/2022  CHIEF COMPLAINT: f/u of high risk for breast cancer  CURRENT THERAPY:  Surveillance  ASSESSMENT & PLAN:  Stacy Hays is a 51 y.o. female with   1. High risk for breast cancer -she has a strong family history of breast cancer-- her mother at age 71, MGM at age 56, two maternal first cousins (one in her 73's), PGM, and a paternal aunt. She also has a maternal aunt with cervical cancer. -She has had benign abnormal Mammograms before. Her genetic testing was negative for pathogenetic mutations.  -The [facility] showed her lifetime risk of breast cancer is 19.8%, higher than the 11% in the general population. This is considered high risk.  -She is currently on closer breast cancer screening with annual screening mammogram and MRIs, self exam, and a routine office visit with physical exam including breast exam by a physician twice a year.  -She previously declined chemoprevention with Tamoxifen.  -genetic testing on 01/01/21 was negative. -she is s/p right lumpectomies on 11/26/21 under Dr. Georgette Dover for a small intraductal papilloma. Postop course was complicated by a large hematoma, which had to be evacuated on 11/27/21. -she is clinically doing well. Physical exam was unremarkable. -she is now overdue for mammogram, as well as MRI. I ordered both today. -F/u as needed.  She will follow-up with her gynecologist for breast cancer screening   2. Menorrhagia -She was on birth control for years but due to increase BP she stopped.  -She did have endometrial ablation for menorrhagia, but was not successful.  -She is  currently on her period (03/02/22), which sound like they are becoming further apart.  She is perimenopausal now.   3. Chronic back pain from ruptured disc, Arthritis  -She did have back surgery previously and pain is now manageable with Cymbalta and tramadol, well controlled.      PLAN:  -Mammogram to be done this month -Breast MRI in 6 months after MM -Follow-up open, she will follow-up with her gynecologist for breast cancer screening.   No problem-specific Assessment & Plan notes found for this encounter.   INTERVAL HISTORY:  Stacy Hays is here for a follow up of high risk for breast cancer. She was last seen by me on 12/02/20. She presents to the clinic alone. She reports she is doing well overall. She notes her lumpectomy went well except that she developed a very large hematoma.   All other systems were reviewed with the patient and are negative.  MEDICAL HISTORY:  Past Medical History:  Diagnosis Date   Arthritis    COVID 06/2021   Depression    Dysfunctional uterine bleeding    Family history of breast cancer    Headache(784.0)    Heart murmur    Herniated disc    Insomnia    Migraine without aura    Overactive bladder    Plantar fasciitis     SURGICAL HISTORY: Past Surgical History:  Procedure Laterality Date   ABDOMINOPLASTY  2006   BREAST BIOPSY Right 2013   BREAST LUMPECTOMY WITH RADIOACTIVE SEED LOCALIZATION Right 11/26/2021   Procedure: RIGHT BREAST LUMPECTOMY WITH RADIOACTIVE SEED LOCALIZATION  X2;  Surgeon: Donnie Mesa, MD;  Location: [facility];  Service: General;  Laterality: Right;   [place]     with endometrial ablation   ENDOMETRIAL ABLATION  2010   HEMATOMA EVACUATION Right 11/27/2021   Procedure: EVACUATION HEMATOMA RIGHT BREAST;  Surgeon: Autumn Messing III, MD;  Location: [facility];  Service: General;  Laterality: Right;  [place] 4   LEEP  1993   had 2 in the same year    [place]  2007    I  have reviewed the social history and family history with the patient and they are unchanged from previous note.  ALLERGIES:  is allergic to promethazine and promethazine hcl.  MEDICATIONS:  Current Outpatient Medications  Medication Sig Dispense Refill   traMADol (ULTRAM) 50 MG tablet Take 2 tablets (100 mg total) by mouth every 6 (six) hours as needed for moderate pain. TAKE 1 TABLET(50 MG) BY MOUTH EVERY 6 HOURS 120 tablet 5   acetaminophen (TYLENOL) 500 MG tablet Take 500-1,000 mg by mouth every 6 (six) hours as needed (for pain).     ALPRAZolam (XANAX) 0.25 MG tablet Take 1 tablet (0.25 mg total) by mouth 3 (three) times daily as needed for anxiety. 90 tablet 5   buPROPion (WELLBUTRIN XL) 150 MG 24 hr tablet Take 1 tablet (150 mg total) by mouth daily. (Patient taking differently: Take 150 mg by mouth at bedtime.) 90 tablet 3   Cholecalciferol (VITAMIN D3) 125 MCG (5000 UT) CAPS Take 5,000 Units by mouth daily with breakfast.     DULoxetine (CYMBALTA) 60 MG capsule Take 1 capsule (60 mg total) by mouth daily. 90 capsule 3   HYDROcodone-acetaminophen (NORCO/VICODIN) 5-325 MG tablet Take 1 tablet by mouth every 6 (six) hours as needed for moderate pain or severe pain. 15 tablet 0   ibuprofen (ADVIL) 200 MG tablet Take 800 mg by mouth every 6 (six) hours as needed for mild pain (or headaches or migraines).     Misc Natural Products (GLUCOS-CHONDROIT-MSM COMPLEX PO) Take 1 tablet by mouth daily.     Multiple Vitamin (MULTIVITAMIN) tablet Take 1 tablet by mouth daily.     ondansetron (ZOFRAN-ODT) 8 MG disintegrating tablet DISSOLVE 1 TABLET ON THE  TONGUE EVERY 8 HOURS AS  NEEDED FOR NAUSEA AND  VOMITING (Patient taking differently: Take 8 mg by mouth every 8 (eight) hours as needed for nausea or vomiting (related to migraines and dissolve orally).) 120 tablet 0   SUMAtriptan (IMITREX) 100 MG tablet TAKE 1 TABLET BY MOUTH AS NEEDED FOR HEADACHE (Patient taking differently: Take 100 mg by mouth daily  as needed for migraine.) 10 tablet 11   valACYclovir (VALTREX) 500 MG tablet Take 1 tablet (500 mg total) by mouth 2 (two) times daily as needed (fever blisters). 60 tablet 5   zolpidem (AMBIEN) 10 MG tablet Take 1 tablet (10 mg total) by mouth at bedtime as needed for sleep. (Patient taking differently: Take 10 mg by mouth at bedtime.) 30 tablet 5   No current facility-administered medications for this visit.    PHYSICAL EXAMINATION: ECOG PERFORMANCE STATUS: 0 - Asymptomatic  Vitals:   03/02/22 1354  BP: (!) 136/96  Pulse: (!) 110  Resp: 16  Temp: 98.6 F (37 C)  SpO2: 95%   Wt Readings from Last 3 Encounters:  03/02/22 209 lb 9.6 oz (95.1 kg)  11/26/21 202 lb 13.2 oz (92 kg)  10/29/21 202 lb 14.4 oz (92 kg)  GENERAL:alert, no distress and comfortable SKIN: skin color, texture, turgor are normal, no rashes or significant lesions EYES: normal, Conjunctiva are pink and non-injected, sclera clear  NECK: supple, thyroid normal size, non-tender, without nodularity LYMPH:  no palpable lymphadenopathy in the cervical, axillary  LUNGS: clear to auscultation and percussion with normal breathing effort HEART: regular rate & rhythm and no murmurs and no lower extremity edema ABDOMEN:abdomen soft, non-tender and normal bowel sounds Musculoskeletal:no cyanosis of digits and no clubbing  NEURO: alert & oriented x 3 with fluent speech, no focal motor/sensory deficits BREAST: No palpable mass, nodules or adenopathy bilaterally. Breast exam benign.   LABORATORY DATA:  I have reviewed the data as listed CBC Latest Ref Rng & Units 11/27/2021 09/04/2021 07/18/2021  WBC 4.0 - 10.5 K/uL 10.7(H) 8.2 15.3(H)  Hemoglobin 12.0 - 15.0 g/dL 10.6(L) 14.3 14.3  Hematocrit 36.0 - 46.0 % 33.1(L) 43.4 43.6  Platelets 150 - 400 K/uL 338 374 435.0(H)     CMP Latest Ref Rng & Units 11/27/2021 09/04/2021 07/18/2021  Glucose 70 - 99 mg/dL 113(H) 86 94  BUN 6 - 20 mg/dL '19 17 13  '$ Creatinine 0.44 - 1.00 mg/dL  1.01(H) 0.82 1.05  Sodium 135 - 145 mmol/L 135 138 141  Potassium 3.5 - 5.1 mmol/L 4.4 4.8 5.4 No hemolysis seen(H)  Chloride 98 - 111 mmol/L 106 101 102  CO2 22 - 32 mmol/L '24 30 30  '$ Calcium 8.9 - 10.3 mg/dL 8.2(L) 9.5 10.2  Total Protein 6.1 - 8.1 g/dL - 6.5 -  Total Bilirubin 0.2 - 1.2 mg/dL - 0.4 -  Alkaline Phos 48 - 121 IU/L - - -  AST 10 - 35 U/L - 16 -  ALT 6 - 29 U/L - 16 -      RADIOGRAPHIC STUDIES: I have personally reviewed the radiological images as listed and agreed with the findings in the report. No results found.    Orders Placed This Encounter  Procedures   MM Digital Screening    Standing Status:   Future    Standing Expiration Date:   03/02/2023    Order Specific Question:   Reason for Exam (SYMPTOM  OR DIAGNOSIS REQUIRED)    Answer:   screening    Order Specific Question:   Is the patient pregnant?    Answer:   No    Order Specific Question:   Preferred imaging location?    Answer:   [facility]   MR BREAST BILATERAL [address] [place] [facility]    Standing Status:   Future    Standing Expiration Date:   03/03/2023    Order Specific Question:   If indicated for the ordered procedure, I authorize the administration of contrast media per Radiology protocol    Answer:   Yes    Order Specific Question:   What is the patient's sedation requirement?    Answer:   No Sedation    Order Specific Question:   Does the patient have a pacemaker or implanted devices?    Answer:   No    Order Specific Question:   Radiology Contrast Protocol - do NOT remove file path    Answer:   [email or web address]    Order Specific Question:   Preferred imaging location?    Answer:   [facility] (table limit-550lbs)   All questions were answered. The patient knows to call the clinic with any problems, questions or concerns. No barriers to learning was detected. The total time spent in  the appointment was 25 minutes.     Truitt Merle,  MD 03/02/2022   I, Wilburn Mylar, am acting as scribe for Truitt Merle, MD.   I have reviewed the above documentation for accuracy and completeness, and I agree with the above.

## 2022-03-23 ENCOUNTER — Other Ambulatory Visit: Payer: Self-pay | Admitting: Family Medicine

## 2022-04-14 ENCOUNTER — Ambulatory Visit
Admission: RE | Admit: 2022-04-14 | Discharge: 2022-04-14 | Disposition: A | Discharge status: Home / Self Care | Payer: 59 | Source: Ambulatory Visit | Attending: Hematology | Admitting: Hematology

## 2022-04-14 DIAGNOSIS — Z803 Family history of malignant neoplasm of breast: Secondary | ICD-10-CM

## 2022-04-15 ENCOUNTER — Other Ambulatory Visit: Payer: Self-pay | Admitting: Hematology

## 2022-04-15 DIAGNOSIS — R928 Other abnormal and inconclusive findings on diagnostic imaging of breast: Secondary | ICD-10-CM

## 2022-04-25 ENCOUNTER — Ambulatory Visit
Admission: RE | Admit: 2022-04-25 | Discharge: 2022-04-25 | Disposition: A | Discharge status: Home / Self Care | Payer: 59 | Source: Ambulatory Visit | Attending: Hematology | Admitting: Hematology

## 2022-04-25 DIAGNOSIS — R928 Other abnormal and inconclusive findings on diagnostic imaging of breast: Secondary | ICD-10-CM

## 2022-06-17 ENCOUNTER — Other Ambulatory Visit: Payer: Self-pay | Admitting: Hematology

## 2022-06-17 DIAGNOSIS — N6489 Other specified disorders of breast: Secondary | ICD-10-CM

## 2022-07-15 ENCOUNTER — Telehealth: Payer: Self-pay | Admitting: Family Medicine

## 2022-07-15 MED ORDER — VALACYCLOVIR HCL 500 MG PO TABS: 500.0000 mg | ORAL_TABLET | Freq: Two times a day (BID) | ORAL | 5 refills | Status: DC | PRN

## 2022-07-15 NOTE — Telephone Encounter (Signed)
Last OV with Dr. Elease Hashimoto- 10/29/2021 Last refill- 08/31/2019   No future OV scheduled.   Okay for refill?

## 2022-07-15 NOTE — Telephone Encounter (Signed)
Done

## 2022-07-15 NOTE — Telephone Encounter (Signed)
Pt call and stated she need a new RX for valACYclovir (VALTREX) 500 MG tablet sent to  [facility], [place] - [address] AT [place] Phone:  [phone]  Fax:  [phone]

## 2022-07-30 ENCOUNTER — Ambulatory Visit (INDEPENDENT_AMBULATORY_CARE_PROVIDER_SITE_OTHER): Payer: 59

## 2022-07-30 ENCOUNTER — Ambulatory Visit: Payer: 59 | Admitting: Adult Health

## 2022-07-30 ENCOUNTER — Encounter: Payer: Self-pay | Admitting: Adult Health

## 2022-07-30 VITALS — BP 110/80 | HR 88 | Temp 98.4°F | Ht 65.0 in | Wt 211.0 lb

## 2022-07-30 DIAGNOSIS — M545 Low back pain, unspecified: Secondary | ICD-10-CM

## 2022-07-30 DIAGNOSIS — L720 Epidermal cyst: Secondary | ICD-10-CM

## 2022-07-30 DIAGNOSIS — M5441 Lumbago with sciatica, right side: Secondary | ICD-10-CM | POA: Diagnosis not present

## 2022-07-30 MED ORDER — CYCLOBENZAPRINE HCL 10 MG PO TABS: 10.0000 mg | ORAL_TABLET | Freq: Every day | ORAL | 0 refills | Status: AC

## 2022-07-30 MED ORDER — METHYLPREDNISOLONE 4 MG PO TBPK: ORAL_TABLET | ORAL | 0 refills | Status: DC

## 2022-07-30 NOTE — Progress Notes (Signed)
Subjective:    Patient ID: Stacy Hays, female    DOB: Mar 07, 1971, 51 y.o.   MRN: [id number]  HPI 51 year old female who  has a past medical history of Arthritis, COVID (06/2021), Depression, Dysfunctional uterine bleeding, Family history of breast cancer, Headache(784.0), Heart murmur, Herniated disc, Insomnia, Migraine without aura, Overactive bladder, and Plantar fasciitis.  She is a patient of Dr. Sarajane Jews who I am seeing today for an acute issue of acute low back pain.  She reports that about 4 days ago she was playing volleyball and stretched out to hit the ball and fell onto the ground causing low back pain that is diffuse across her lower back but more so on the right side with some discomfort in her right buttock.  Pain is about a 3 out of 10 and is described as a dullness.  She feels it with certain range of motion such as bending and twisting, and walking.  She does have a history of lumbar disc surgery in her 60s as well as lumbar disc bulge that has not been repaired.  Additionally, she also has "a knot on the left side of her neck that is about the size of a dime that she first noticed about a month ago.  It has not really grown in size and no pain noted.  Review of Systems See HPI   Past Medical History:  Diagnosis Date   Arthritis    COVID 06/2021   Depression    Dysfunctional uterine bleeding    Family history of breast cancer    Headache(784.0)    Heart murmur    Herniated disc    Insomnia    Migraine without aura    Overactive bladder    Plantar fasciitis     Social History   Socioeconomic History   Marital status: Married    Spouse name: Not on file   Number of children: 3   Years of education: Not on file   Highest education level: Not on file  Occupational History   Not on file  Tobacco Use   Smoking status: Never   Smokeless tobacco: Never  Vaping Use   Vaping Use: Never used  Substance and Sexual Activity   Alcohol use: Yes    Comment: socially    Drug use: No   Sexual activity: Yes    Partners: Male    Birth control/protection: Surgical    Comment: vasectomy & pt had ablation  Other Topics Concern   Not on file  Social History Narrative   Not on file   Social Determinants of Health   Financial Resource Strain: Not on file  Food Insecurity: Not on file  Transportation Needs: Not on file  Physical Activity: Not on file  Stress: Not on file  Social Connections: Not on file  Intimate Partner Violence: Not on file    Past Surgical History:  Procedure Laterality Date   ABDOMINOPLASTY  2006   BREAST BIOPSY Right 2013   BREAST LUMPECTOMY WITH RADIOACTIVE SEED LOCALIZATION Right 11/26/2021   Procedure: RIGHT BREAST LUMPECTOMY WITH RADIOACTIVE SEED LOCALIZATION X2;  Surgeon: Donnie Mesa, MD;  Location: [facility];  Service: General;  Laterality: Right;   [place]     with endometrial ablation   ENDOMETRIAL ABLATION  2010   HEMATOMA EVACUATION Right 11/27/2021   Procedure: EVACUATION HEMATOMA RIGHT BREAST;  Surgeon: Jovita Kussmaul, MD;  Location: [facility];  Service: General;  Laterality: Right;  60 MINUTES  ROOM 4   LEEP  1993   had 2 in the same year    [place]  2007    Family History  Problem Relation Age of Onset   Diabetes Mother 79   Hyperlipidemia Mother 67   Hypertension Mother    Breast cancer Mother 25   Heart disease Mother    Hypertension Father    Cervical cancer Maternal Aunt    Mental retardation Maternal Aunt    Cerebral palsy Maternal Aunt    Breast cancer Maternal Grandmother 71   Heart disease Maternal Grandfather 38   Birth defects Paternal Grandmother    Breast cancer Paternal Grandmother    Heart disease Paternal Grandfather    Diabetes Paternal Grandfather    Stroke Paternal Grandfather    Breast cancer Cousin        dx in her 54s, mat first cousin   Breast cancer Cousin        mat first cousin   Kidney disease Other    Prostate cancer Other    Sudden  death Other    Coronary artery disease Other     Allergies  Allergen Reactions   Promethazine Other (See Comments)    Dystonic reaction    Current Outpatient Medications on File Prior to Visit  Medication Sig Dispense Refill   acetaminophen (TYLENOL) 500 MG tablet Take 500-1,000 mg by mouth every 6 (six) hours as needed (for pain).     ALPRAZolam (XANAX) 0.25 MG tablet Take 1 tablet (0.25 mg total) by mouth 3 (three) times daily as needed for anxiety. 90 tablet 5   buPROPion (WELLBUTRIN XL) 150 MG 24 hr tablet Take 1 tablet (150 mg total) by mouth daily. (Patient taking differently: Take 150 mg by mouth at bedtime.) 90 tablet 3   Cholecalciferol (VITAMIN D3) 125 MCG (5000 UT) CAPS Take 5,000 Units by mouth daily with breakfast.     DULoxetine (CYMBALTA) 60 MG capsule Take 1 capsule (60 mg total) by mouth daily. 90 capsule 3   HYDROcodone-acetaminophen (NORCO/VICODIN) 5-325 MG tablet Take 1 tablet by mouth every 6 (six) hours as needed for moderate pain or severe pain. 15 tablet 0   ibuprofen (ADVIL) 200 MG tablet Take 800 mg by mouth every 6 (six) hours as needed for mild pain (or headaches or migraines).     Misc Natural Products (GLUCOS-CHONDROIT-MSM COMPLEX PO) Take 1 tablet by mouth daily.     Multiple Vitamin (MULTIVITAMIN) tablet Take 1 tablet by mouth daily.     ondansetron (ZOFRAN-ODT) 8 MG disintegrating tablet DISSOLVE 1 TABLET ON THE  TONGUE EVERY 8 HOURS AS  NEEDED FOR NAUSEA AND  VOMITING (Patient taking differently: Take 8 mg by mouth every 8 (eight) hours as needed for nausea or vomiting (related to migraines and dissolve orally).) 120 tablet 0   SUMAtriptan (IMITREX) 100 MG tablet TAKE 1 TABLET BY MOUTH AS NEEDED FOR HEADACHE (Patient taking differently: Take 100 mg by mouth daily as needed for migraine.) 10 tablet 11   traMADol (ULTRAM) 50 MG tablet Take 2 tablets (100 mg total) by mouth every 6 (six) hours as needed for moderate pain. TAKE 1 TABLET(50 MG) BY MOUTH EVERY 6  HOURS 120 tablet 5   valACYclovir (VALTREX) 500 MG tablet Take 1 tablet (500 mg total) by mouth 2 (two) times daily as needed (fever blisters). 60 tablet 5   zolpidem (AMBIEN) 10 MG tablet Take 1 tablet (10 mg total) by mouth at bedtime as needed for sleep. (  Patient taking differently: Take 10 mg by mouth at bedtime.) 30 tablet 5   No current facility-administered medications on file prior to visit.    BP 110/80   Pulse 88   Temp 98.4 F (36.9 C) (Oral)   Ht '5\' 5"'$  (1.651 m)   Wt 211 lb (95.7 kg)   SpO2 95%   BMI 35.11 kg/m       Objective:   Physical Exam Vitals and nursing note reviewed.  Constitutional:      Appearance: Normal appearance.  Musculoskeletal:        General: Tenderness present.     Lumbar back: Tenderness present. No deformity or bony tenderness. Decreased range of motion.       Back:       Legs:  Skin:    General: Skin is warm and dry.     Capillary Refill: Capillary refill takes less than 2 seconds.     Comments: Small 1 cm cyst noted behind left ear at hairline  Neurological:     General: No focal deficit present.     Mental Status: She is alert and oriented to person, place, and time.  Psychiatric:        Mood and Affect: Mood normal.        Behavior: Behavior normal.        Thought Content: Thought content normal.        Judgment: Judgment normal.        Assessment & Plan:  1. Acute right-sided low back pain without sciatica -Appears to be muscular in origin.  Due to history we will get x-ray of lumbar spine.  Will prescribe Flexeril and prednisone Dosepak.  She was advised that muscle relaxer may make her sleepy.  Follow-up with PCP if no improvement - methylPREDNISolone (MEDROL DOSEPAK) 4 MG TBPK tablet; Take as directed  Dispense: 21 tablet; Refill: 0 - cyclobenzaprine (FLEXERIL) 10 MG tablet; Take 1 tablet (10 mg total) by mouth at bedtime.  Dispense: 15 tablet; Refill: 0 - DG Lumbar Spine Complete; Future  2. Epidermal cyst of neck -  advised watchful waiting.   Dorothyann Peng, NP

## 2022-08-22 ENCOUNTER — Other Ambulatory Visit: Payer: Self-pay | Admitting: Family Medicine

## 2022-08-24 ENCOUNTER — Other Ambulatory Visit: Payer: Self-pay | Admitting: Family Medicine

## 2022-08-24 NOTE — Telephone Encounter (Signed)
Dr. Barbie Banner patient.   Last refill- 11/25/21--30 tabs, 5 refills Last OV--07/30/22  No future OV scheduled.  Can this patient receive a refill?

## 2022-09-14 ENCOUNTER — Ambulatory Visit
Admission: RE | Admit: 2022-09-14 | Discharge: 2022-09-14 | Disposition: A | Discharge status: Home / Self Care | Payer: 59 | Source: Ambulatory Visit | Attending: Hematology | Admitting: Hematology

## 2022-09-14 ENCOUNTER — Ambulatory Visit: Payer: 59

## 2022-09-14 DIAGNOSIS — N6489 Other specified disorders of breast: Secondary | ICD-10-CM

## 2022-09-22 ENCOUNTER — Ambulatory Visit: Payer: 59 | Admitting: Family Medicine

## 2022-09-22 ENCOUNTER — Encounter: Payer: Self-pay | Admitting: Family Medicine

## 2022-09-22 VITALS — BP 110/78 | HR 91 | Temp 98.3°F | Wt 213.0 lb

## 2022-09-22 DIAGNOSIS — L578 Other skin changes due to chronic exposure to nonionizing radiation: Secondary | ICD-10-CM

## 2022-09-22 MED ORDER — TRIAMCINOLONE ACETONIDE 0.1 % EX CREA: 1.0000 | TOPICAL_CREAM | Freq: Two times a day (BID) | CUTANEOUS | 2 refills | Status: DC

## 2022-09-22 NOTE — Progress Notes (Signed)
   Subjective:    Patient ID: Stacy Hays, female    DOB: [birth year], 51 y.o.   MRN: [id number]  HPI Here for 3 weeks of an itchy rash on the back of her neck, the upper back, and the upper chest areas. This started around the same time that she spent a day at the swimming pool. She has taken Benadryl and applied OTC cortisone cream with no relief.    Review of Systems  Constitutional: Negative.   Respiratory: Negative.    Cardiovascular: Negative.   Skin:  Positive for rash.       Objective:   Physical Exam Constitutional:      Appearance: Normal appearance. She is not ill-appearing.  Cardiovascular:     Rate and Rhythm: Normal rate and regular rhythm.     Pulses: Normal pulses.     Heart sounds: Normal heart sounds.  Pulmonary:     Effort: Pulmonary effort is normal.     Breath sounds: Normal breath sounds.  Skin:    Comments: Areas of maculopapular red skin in the above locations   Neurological:     Mental Status: She is alert.           Assessment & Plan:  Sun exposure dermatitis, treat with Triamcinolone cream BID until clear.  Alysia Penna, MD

## 2022-09-23 ENCOUNTER — Other Ambulatory Visit: Payer: Self-pay | Admitting: Internal Medicine

## 2022-11-06 ENCOUNTER — Other Ambulatory Visit: Payer: Self-pay | Admitting: Family Medicine

## 2022-11-09 NOTE — Telephone Encounter (Addendum)
Last OV- 09/22/22 Last refill-11/28/2021---120 tabs, 5 refills  No future OV scheduled.

## 2022-11-12 ENCOUNTER — Telehealth: Payer: Self-pay | Admitting: Family Medicine

## 2022-11-12 MED ORDER — BUPROPION HCL ER (XL) 150 MG PO TB24: ORAL_TABLET | ORAL | 3 refills | Status: DC

## 2022-11-12 MED ORDER — ZOLPIDEM TARTRATE 10 MG PO TABS: ORAL_TABLET | ORAL | 5 refills | Status: DC

## 2022-11-12 NOTE — Telephone Encounter (Signed)
Done

## 2022-11-12 NOTE — Telephone Encounter (Signed)
Pt is calling and need refills on buPROPion (WELLBUTRIN XL) 150 MG 24 hr tablet and  zolpidem (AMBIEN) 10 MG tablet . Pt said she received in office written rx's that was another patient

## 2022-11-23 ENCOUNTER — Other Ambulatory Visit: Payer: Self-pay | Admitting: Family Medicine

## 2022-11-25 ENCOUNTER — Telehealth: Payer: Self-pay | Admitting: Family Medicine

## 2022-11-25 NOTE — Telephone Encounter (Signed)
Pt is calling and need refill on traMADol (ULTRAM) 50 MG tablet  Pt said she received in office written rx's that belonged to another patient  earlier this month.

## 2022-11-26 NOTE — Telephone Encounter (Signed)
Pt called to say Pharmacy is telling her this refill (traMADol (ULTRAM) 50 MG tablet ) has been denied by MD.  Pt states MD may need to send the PA. Pt is asking for assistance resolving this refill issue.  LOV:  09/22/22 = Acute visit (Rash)  [id number] [address], [place] AT [address] Phone: [phone]  Fax: [phone]

## 2022-11-27 ENCOUNTER — Emergency Department (HOSPITAL_BASED_OUTPATIENT_CLINIC_OR_DEPARTMENT_OTHER)
Admission: EM | Admit: 2022-11-27 | Discharge: 2022-11-27 | Disposition: A | Discharge status: Home / Self Care | Payer: 59 | Attending: Emergency Medicine | Admitting: Emergency Medicine

## 2022-11-27 ENCOUNTER — Other Ambulatory Visit: Payer: Self-pay

## 2022-11-27 DIAGNOSIS — M5431 Sciatica, right side: Secondary | ICD-10-CM

## 2022-11-27 DIAGNOSIS — M5441 Lumbago with sciatica, right side: Secondary | ICD-10-CM | POA: Insufficient documentation

## 2022-11-27 DIAGNOSIS — M545 Low back pain, unspecified: Secondary | ICD-10-CM | POA: Diagnosis present

## 2022-11-27 MED ORDER — METHYLPREDNISOLONE SODIUM SUCC 125 MG IJ SOLR
125.0000 mg | Freq: Once | INTRAMUSCULAR | Status: AC
  Administered 2022-11-27: 125 mg via INTRAMUSCULAR
  Filled 2022-11-27: qty 2

## 2022-11-27 MED ORDER — METHYLPREDNISOLONE 4 MG PO TBPK: ORAL_TABLET | ORAL | 0 refills | Status: DC

## 2022-11-27 MED ORDER — NAPROXEN 500 MG PO TABS: 500.0000 mg | ORAL_TABLET | Freq: Two times a day (BID) | ORAL | 0 refills | Status: AC

## 2022-11-27 MED ORDER — KETOROLAC TROMETHAMINE 30 MG/ML IJ SOLN
30.0000 mg | Freq: Once | INTRAMUSCULAR | Status: AC
  Administered 2022-11-27: 30 mg via INTRAMUSCULAR
  Filled 2022-11-27: qty 1

## 2022-11-27 NOTE — ED Provider Notes (Signed)
[facility]  Provider Note  CSN: [id number] Arrival date & time: 11/27/22 0451  History Chief Complaint  Patient presents with   Back Pain    Stacy Hays is a 51 y.o. female with remote discectomy for ruptured disc at L4-5 20+ years ago reports about a week of worsening R lower back pain, radiating into her hip and thigh. No numbness or weakness but hurts to walk and so she has been mostly lying flat for pain relief. She has not had any bowel or bladder incontinence. No fever, but she has had a cough recently which she thinks exacerbated her back pain. She reports taking some old tizanidine with some relief. She has a regular Rx for Tramadol from her PCP but there was a mix up with her Rx earlier this week and she only has a few tablets left. She had an episode of R lower back pain after playing volleyball earlier in the year and had good relief with an Medrol dosepak.    Home Medications Prior to Admission medications   Medication Sig Start Date End Date Taking? Authorizing Provider  methylPREDNISolone (MEDROL DOSEPAK) 4 MG TBPK tablet Use as directed 11/27/22  Yes Truddie Hidden, MD  naproxen (NAPROSYN) 500 MG tablet Take 1 tablet (500 mg total) by mouth 2 (two) times daily. 11/27/22  Yes Truddie Hidden, MD  acetaminophen (TYLENOL) 500 MG tablet Take 500-1,000 mg by mouth every 6 (six) hours as needed (for pain).    [provider]  ALPRAZolam Duanne Moron) 0.25 MG tablet Take 1 tablet (0.25 mg total) by mouth 3 (three) times daily as needed for anxiety. 06/11/21   Laurey Morale, MD  buPROPion (WELLBUTRIN XL) 150 MG 24 hr tablet TAKE 1 TABLET(150 MG) BY MOUTH DAILY 11/12/22   Laurey Morale, MD  Cholecalciferol (VITAMIN D3) 125 MCG (5000 UT) CAPS Take 5,000 Units by mouth daily with breakfast.    [provider]  cyclobenzaprine (FLEXERIL) 10 MG tablet Take 1 tablet (10 mg total) by mouth at bedtime. 07/30/22   Nafziger, Tommi Rumps, NP  DULoxetine  (CYMBALTA) 60 MG capsule TAKE 1 CAPSULE(60 MG) BY MOUTH DAILY 11/24/22   Laurey Morale, MD  HYDROcodone-acetaminophen (NORCO/VICODIN) 5-325 MG tablet Take 1 tablet by mouth every 6 (six) hours as needed for moderate pain or severe pain. 11/27/21   Autumn Messing III, MD  ibuprofen (ADVIL) 200 MG tablet Take 800 mg by mouth every 6 (six) hours as needed for mild pain (or headaches or migraines).    [provider]  Misc Natural Products (GLUCOS-CHONDROIT-MSM COMPLEX PO) Take 1 tablet by mouth daily.    [provider]  Multiple Vitamin (MULTIVITAMIN) tablet Take 1 tablet by mouth daily.    [provider]  ondansetron (ZOFRAN-ODT) 8 MG disintegrating tablet DISSOLVE 1 TABLET ON THE  TONGUE EVERY 8 HOURS AS  NEEDED FOR NAUSEA AND  VOMITING Patient taking differently: Take 8 mg by mouth every 8 (eight) hours as needed for nausea or vomiting (related to migraines and dissolve orally). 08/21/19   Laurey Morale, MD  SUMAtriptan (IMITREX) 100 MG tablet TAKE 1 TABLET BY MOUTH AS NEEDED FOR HEADACHE Patient taking differently: Take 100 mg by mouth daily as needed for migraine. 08/14/19   Laurey Morale, MD  traMADol (ULTRAM) 50 MG tablet TAKE 2 TABLETS BY MOUTH EVERY 6 HOURS AS NEEDED FOR MODERATE PAIN. TAKE ONE TABLET BY MOUTH EVERY 6 HOURS 11/10/22   Laurey Morale,  MD  triamcinolone cream (KENALOG) 0.1 % Apply 1 Application topically 2 (two) times daily. 09/22/22   Laurey Morale, MD  valACYclovir (VALTREX) 500 MG tablet Take 1 tablet (500 mg total) by mouth 2 (two) times daily as needed (fever blisters). 07/15/22   Laurey Morale, MD  zolpidem (AMBIEN) 10 MG tablet TAKE 1 TABLET(10 MG) BY MOUTH AT BEDTIME AS NEEDED FOR SLEEP 11/12/22   Laurey Morale, MD     Allergies    Promethazine   Review of Systems   Review of Systems Please see HPI for pertinent positives and negatives  Physical Exam BP 104/69   Pulse 91   Temp 98.2 F (36.8 C)   Resp 20   Ht '5\' 6"'$  (1.676 m)   Wt  86.2 kg   SpO2 100%   BMI 30.67 kg/m   Physical Exam Vitals and nursing note reviewed.  HENT:     Head: Normocephalic.     Nose: Nose normal.  Eyes:     Extraocular Movements: Extraocular movements intact.  Pulmonary:     Effort: Pulmonary effort is normal.  Musculoskeletal:        General: Tenderness (R lower back) present. Normal range of motion.     Cervical back: Neck supple.  Skin:    Findings: No rash (on exposed skin).  Neurological:     Mental Status: She is alert and oriented to person, place, and time.     Sensory: No sensory deficit.     Motor: No weakness.     Deep Tendon Reflexes: Reflexes normal.  Psychiatric:        Mood and Affect: Mood normal.     ED Results / Procedures / Treatments   EKG None  Procedures Procedures  Medications Ordered in the ED Medications  ketorolac (TORADOL) 30 MG/ML injection 30 mg (30 mg Intramuscular Given 11/27/22 0529)  methylPREDNISolone sodium succinate (SOLU-MEDROL) 125 mg/2 mL injection 125 mg (125 mg Intramuscular Given 11/27/22 0532)    Initial Impression and Plan  Patient here with R lower back pain/sciatica. No red flags. Will give IM toradol and medrol and reassess for improvement.   ED Course   Clinical Course as of 11/27/22 0612  Fri Nov 27, 2022  0609 Patient reports improved back pain, feels comfortable going home. Will Rx Naprosyn and Medrol as this has helped in the past. Recommend she follow up with PCP if not getting relief for re-eval and consideration of Neurosurgery referral.  [CS]    Clinical Course User Index [CS] Truddie Hidden, MD     MDM Rules/Calculators/A&P Medical Decision Making Problems Addressed: Sciatica, right side: acute illness or injury  Risk Prescription drug management.    Final Clinical Impression(s) / ED Diagnoses Final diagnoses:  Sciatica, right side    Rx / DC Orders ED Discharge Orders          Ordered    naproxen (NAPROSYN) 500 MG tablet  2 times daily         11/27/22 0611    methylPREDNISolone (MEDROL DOSEPAK) 4 MG TBPK tablet        11/27/22 3295             Truddie Hidden, MD 11/27/22 [phone]

## 2022-11-27 NOTE — ED Triage Notes (Signed)
POV from home, 1 wk ago right sided back pain began, feels like something is pulled. Now pain is radiating down to right hip, right leg anterior and posterior. Pt sts it feels like her hips are being ripped apart. Pt alert and orieneted x 4.

## 2022-11-27 NOTE — ED Notes (Signed)
Pt reports prior hx of back complications that included surgery.  Pt states she thinks she agitated it coughing.  Pain sometimes wraps around her hip and goes down the front of her leg.

## 2022-12-01 ENCOUNTER — Ambulatory Visit: Payer: 59 | Admitting: Family Medicine

## 2022-12-01 ENCOUNTER — Encounter: Payer: Self-pay | Admitting: Family Medicine

## 2022-12-01 VITALS — BP 122/80 | HR 78 | Temp 98.3°F | Wt 204.0 lb

## 2022-12-01 DIAGNOSIS — M5431 Sciatica, right side: Secondary | ICD-10-CM | POA: Diagnosis not present

## 2022-12-01 MED ORDER — TRAMADOL HCL 50 MG PO TABS: 100.0000 mg | ORAL_TABLET | Freq: Four times a day (QID) | ORAL | 2 refills | Status: DC | PRN

## 2022-12-01 MED ORDER — PREDNISONE 20 MG PO TABS: 20.0000 mg | ORAL_TABLET | Freq: Two times a day (BID) | ORAL | 0 refills | Status: DC

## 2022-12-01 MED ORDER — TIZANIDINE HCL 4 MG PO CAPS: 4.0000 mg | ORAL_CAPSULE | Freq: Three times a day (TID) | ORAL | 2 refills | Status: AC | PRN

## 2022-12-01 NOTE — Progress Notes (Signed)
   Subjective:    Patient ID: Stacy Hays, female    DOB: 09-17-1971, 51 y.o.   MRN: [id number]  HPI Here to follow up an ED visit on 11-27-22 for low back pain. She is S/P disc surgery on L4-5 in 2007. She has had low back pain off and on for months now. A lumbar Xray taken on 07-30-22 showed facet arthropathy and advanced degeneration of the disc at L5-S1. The pain starts in the right lower back and it radiates down the right leg to the foot. No numbness or weakness. No recent trauma. She has had the pain for months, but int helast week it has gotten much worse. At the ED they gave her shots of Toradol and SoluMedrol, and she is midway through a Medrol dose pack. She is also taking Naprosyn, but she has run out of Tramadol.    Review of Systems  Constitutional: Negative.   Respiratory: Negative.    Cardiovascular: Negative.   Gastrointestinal: Negative.   Genitourinary: Negative.   Musculoskeletal:  Positive for back pain.       Objective:   Physical Exam Constitutional:      Comments: In some pain   Cardiovascular:     Rate and Rhythm: Normal rate and regular rhythm.     Pulses: Normal pulses.     Heart sounds: Normal heart sounds.  Pulmonary:     Effort: Pulmonary effort is normal.     Breath sounds: Normal breath sounds.  Musculoskeletal:     Comments: Tender over the lower spine and the right sciatic notch. ROM is full, but SLR are positive on both sides.   Neurological:     Mental Status: She is alert.           Assessment & Plan:  Right sciatica. We will set up a lumbar MRI with and without contrast asap. We will stop the Medrol dose pack and instead start on Prednisone 20 mg BID for the next 10 days. She can use Tramadol and Tizanidine as needed. We spent a total of ( 33  ) minutes reviewing records and discussing these issues.  Alysia Penna, MD  Alysia Penna, MD

## 2022-12-02 LAB — BASIC METABOLIC PANEL
BUN: 20 mg/dL (ref 6–23)
CO2: 31 mEq/L (ref 19–32)
Calcium: 9.6 mg/dL (ref 8.4–10.5)
Chloride: 97 mEq/L (ref 96–112)
Creatinine, Ser: 0.93 mg/dL (ref 0.40–1.20)
GFR: 71.05 mL/min (ref >60.00)
Glucose, Bld: 85 mg/dL (ref 70–99)
Potassium: 4.4 mEq/L (ref 3.5–5.1)
Sodium: 135 mEq/L (ref 135–145)

## 2022-12-02 NOTE — Telephone Encounter (Signed)
Medication was picked up from pharmacy on 12/01/22

## 2022-12-14 ENCOUNTER — Ambulatory Visit
Admission: RE | Admit: 2022-12-14 | Discharge: 2022-12-14 | Disposition: A | Discharge status: Home / Self Care | Payer: 59 | Source: Ambulatory Visit | Attending: Family Medicine | Admitting: Family Medicine

## 2022-12-14 ENCOUNTER — Telehealth: Payer: Self-pay | Admitting: Family Medicine

## 2022-12-14 DIAGNOSIS — M5431 Sciatica, right side: Secondary | ICD-10-CM

## 2022-12-14 MED ORDER — GADOBENATE DIMEGLUMINE 529 MG/ML IV SOLN
19.0000 mL | Freq: Once | INTRAVENOUS | Status: AC | PRN
  Administered 2022-12-14: 19 mL via INTRAVENOUS

## 2022-12-14 MED ORDER — PREDNISONE 20 MG PO TABS: 20.0000 mg | ORAL_TABLET | Freq: Two times a day (BID) | ORAL | 0 refills | Status: DC

## 2022-12-14 NOTE — Telephone Encounter (Signed)
Notified pt via My Chart.

## 2022-12-14 NOTE — Telephone Encounter (Signed)
Pt is calling and she is sch for MRI today and does not have any more predniSONE (DELTASONE) 20 MG tablet  and prednisone is the only thing that help her back pain.  [facility] [place], [address] Phone: [phone]  Fax: [phone]

## 2022-12-14 NOTE — Telephone Encounter (Signed)
Done

## 2022-12-14 NOTE — Telephone Encounter (Signed)
Last refill-12/01/22.  Pharmacy updated.

## 2022-12-16 NOTE — Telephone Encounter (Signed)
Please advise 

## 2022-12-16 NOTE — Telephone Encounter (Signed)
I did the referral 

## 2022-12-17 NOTE — Addendum Note (Signed)
Addended by: Alysia Penna A on: 12/17/2022 07:34 AM   Modules accepted: Orders

## 2023-02-04 ENCOUNTER — Other Ambulatory Visit: Payer: Self-pay | Admitting: Hematology

## 2023-02-04 DIAGNOSIS — Z1231 Encounter for screening mammogram for malignant neoplasm of breast: Secondary | ICD-10-CM

## 2023-02-13 ENCOUNTER — Other Ambulatory Visit: Payer: Self-pay | Admitting: Family Medicine

## 2023-04-16 ENCOUNTER — Ambulatory Visit
Admission: RE | Admit: 2023-04-16 | Discharge: 2023-04-16 | Disposition: A | Discharge status: Home / Self Care | Payer: 59 | Source: Ambulatory Visit | Attending: Hematology | Admitting: Hematology

## 2023-04-16 DIAGNOSIS — Z1231 Encounter for screening mammogram for malignant neoplasm of breast: Secondary | ICD-10-CM

## 2023-04-22 ENCOUNTER — Other Ambulatory Visit: Payer: Self-pay | Admitting: Family Medicine

## 2023-04-22 NOTE — Telephone Encounter (Signed)
Pt LOV was on 12/01/22 Last refill was done on 12/01/22 Please advise

## 2023-05-18 ENCOUNTER — Encounter: Payer: Self-pay | Admitting: Family Medicine

## 2023-05-20 ENCOUNTER — Other Ambulatory Visit: Payer: Self-pay | Admitting: Family Medicine

## 2023-05-21 NOTE — Telephone Encounter (Signed)
Pt LOV was on 12/01/2022 Last refill was done on 11/12/2022 Pt will be going out of town next week and will be out of the med, PCP not available. Pt requests for a refill to last her until her CPE on 06/07/23 Please advise

## 2023-05-22 ENCOUNTER — Other Ambulatory Visit: Payer: Self-pay | Admitting: Family Medicine

## 2023-06-07 ENCOUNTER — Ambulatory Visit (INDEPENDENT_AMBULATORY_CARE_PROVIDER_SITE_OTHER): Payer: 59 | Admitting: Family Medicine

## 2023-06-07 ENCOUNTER — Ambulatory Visit: Payer: 59 | Admitting: Family Medicine

## 2023-06-07 ENCOUNTER — Encounter: Payer: Self-pay | Admitting: Family Medicine

## 2023-06-07 VITALS — BP 118/78 | HR 87 | Temp 98.1°F | Ht 66.0 in | Wt 215.0 lb

## 2023-06-07 DIAGNOSIS — Z0000 Encounter for general adult medical examination without abnormal findings: Secondary | ICD-10-CM

## 2023-06-07 LAB — HEMOGLOBIN A1C: Hgb A1c MFr Bld: 5.3 % (ref 4.6–6.5)

## 2023-06-07 LAB — T3, FREE: T3, Free: 2.7 pg/mL (ref 2.3–4.2)

## 2023-06-07 LAB — TSH: TSH: 2.59 u[IU]/mL (ref 0.35–5.50)

## 2023-06-07 LAB — LIPID PANEL
Cholesterol: 204 mg/dL — ABNORMAL HIGH (ref 0–200)
HDL: 69.5 mg/dL
LDL Cholesterol: 113 mg/dL — ABNORMAL HIGH (ref 0–99)
NonHDL: 134.62
Total CHOL/HDL Ratio: 3
Triglycerides: 106 mg/dL (ref 0.0–149.0)
VLDL: 21.2 mg/dL (ref 0.0–40.0)

## 2023-06-07 LAB — T4, FREE: Free T4: 0.74 ng/dL (ref 0.60–1.60)

## 2023-06-07 MED ORDER — VALACYCLOVIR HCL 500 MG PO TABS: 500.0000 mg | ORAL_TABLET | Freq: Two times a day (BID) | ORAL | 5 refills | Status: AC | PRN

## 2023-06-07 MED ORDER — PHENTERMINE HCL 37.5 MG PO CAPS
37.5000 mg | ORAL_CAPSULE | ORAL | 1 refills | Status: DC
Start: 2023-06-07 — End: 2023-12-24

## 2023-06-07 MED ORDER — SUMATRIPTAN SUCCINATE 100 MG PO TABS: ORAL_TABLET | ORAL | 11 refills | Status: AC

## 2023-06-07 NOTE — Progress Notes (Signed)
Subjective:    Patient ID: LATAUSHA FLAMM, female    DOB: 10-19-1971, 52 y.o.   MRN: [id number]  HPI Here for a well exam. She feels well in general except for her low back pain. She last saw Dr. Tressie Stalker in December, and he has been treating her with Gabapentin. She was originally taking 300 mg TID, but she has cut this down to BID. She wants to stop it completely. She had labs drawn at [facility] recently that included a CBC, BMET, and liver panel that were normal.    Review of Systems  Constitutional: Negative.   HENT: Negative.    Eyes: Negative.   Respiratory: Negative.    Cardiovascular: Negative.   Gastrointestinal: Negative.   Genitourinary:  Negative for decreased urine volume, difficulty urinating, dyspareunia, dysuria, enuresis, flank pain, frequency, hematuria, pelvic pain and urgency.  Musculoskeletal:  Positive for back pain.  Skin: Negative.   Neurological: Negative.  Negative for headaches.  Psychiatric/Behavioral: Negative.         Objective:   Physical Exam Constitutional:      General: She is not in acute distress.    Appearance: Normal appearance. She is well-developed.  HENT:     Head: Normocephalic and atraumatic.     Right Ear: External ear normal.     Left Ear: External ear normal.     Nose: Nose normal.     Mouth/Throat:     Pharynx: No oropharyngeal exudate.  Eyes:     General: No scleral icterus.    Conjunctiva/sclera: Conjunctivae normal.     Pupils: Pupils are equal, round, and reactive to light.  Neck:     Thyroid: No thyromegaly.     Vascular: No JVD.  Cardiovascular:     Rate and Rhythm: Normal rate and regular rhythm.     Heart sounds: Normal heart sounds. No murmur heard.    No friction rub. No gallop.  Pulmonary:     Effort: Pulmonary effort is normal. No respiratory distress.     Breath sounds: Normal breath sounds. No wheezing or rales.  Chest:     Chest wall: No tenderness.  Abdominal:     General: Bowel sounds  are normal. There is no distension.     Palpations: Abdomen is soft. There is no mass.     Tenderness: There is no abdominal tenderness. There is no guarding or rebound.  Musculoskeletal:        General: No tenderness. Normal range of motion.     Cervical back: Normal range of motion and neck supple.  Lymphadenopathy:     Cervical: No cervical adenopathy.  Skin:    General: Skin is warm and dry.     Findings: No erythema or rash.  Neurological:     Mental Status: She is alert and oriented to person, place, and time.     Cranial Nerves: No cranial nerve deficit.     Motor: No abnormal muscle tone.     Coordination: Coordination normal.     Deep Tendon Reflexes: Reflexes are normal and symmetric. Reflexes normal.  Psychiatric:        Mood and Affect: Mood normal.        Behavior: Behavior normal.        Thought Content: Thought content normal.        Judgment: Judgment normal.           Assessment & Plan:  Well exam. We discussed diet and exercise. Get fasting labs  including lipids, a thyroid panel, and A1c. I advisedher to decrease the Gabapentin to 300 mg only at bedtime for 2 weeks, and then stop it.  Gershon Crane, MD

## 2023-07-01 ENCOUNTER — Other Ambulatory Visit: Payer: Self-pay | Admitting: Adult Health

## 2023-07-02 NOTE — Telephone Encounter (Signed)
Okay for refill?  

## 2023-08-04 ENCOUNTER — Ambulatory Visit (INDEPENDENT_AMBULATORY_CARE_PROVIDER_SITE_OTHER): Payer: 59 | Admitting: Radiology

## 2023-08-04 ENCOUNTER — Other Ambulatory Visit (HOSPITAL_COMMUNITY)
Admission: RE | Admit: 2023-08-04 | Discharge: 2023-08-04 | Disposition: A | Discharge status: Home / Self Care | Payer: 59 | Source: Ambulatory Visit | Attending: Radiology | Admitting: Radiology

## 2023-08-04 ENCOUNTER — Encounter: Payer: Self-pay | Admitting: Radiology

## 2023-08-04 VITALS — BP 116/80 | Ht 65.0 in | Wt 207.0 lb

## 2023-08-04 DIAGNOSIS — Z01419 Encounter for gynecological examination (general) (routine) without abnormal findings: Secondary | ICD-10-CM

## 2023-08-04 NOTE — Progress Notes (Signed)
   Stacy Hays 14-Jan-1971 [id number]   History: Postmenopausal 52 y.o. presents for annual exam. LEEP 1993, normal paps in the past 10 years. Last pap 2018 neg/neg. Perimenopausal, every few months will have scant bleeding for a few days. Some hot flashes and night sweats, tolerating well without HRT.   Gynecologic History Postmenopausal Last Pap: 2018. Results were: normal Last mammogram: 4/24. Results were: normal Last colonoscopy: 2022 HRT use: no  Obstetric History OB History  Gravida Para Term Preterm AB Living  3 3 3     3   SAB IAB Ectopic Multiple Live Births          3    # Outcome Date GA Lbr Len/2nd Weight Sex Type Anes PTL Lv  3 Term 08/16/02     Vag-Spont     2 Term 03/04/99     Vag-Spont     1 Term 12/06/91     Vag-Spont        The following portions of the patient's history were reviewed and updated as appropriate: allergies, current medications, past family history, past medical history, past social history, past surgical history, and problem list.  Review of Systems Pertinent items noted in HPI and remainder of comprehensive ROS otherwise negative.  Past medical history, past surgical history, family history and social history were all reviewed and documented in the EPIC chart.  Exam:  Vitals:   08/04/23 1408  BP: 116/80  Weight: 207 lb (93.9 kg)  Height: 5\' 5"  (1.651 m)   Body mass index is 34.45 kg/m.  General appearance:  Normal Thyroid:  Symmetrical, normal in size, without palpable masses or nodularity. Respiratory  Auscultation:  Clear without wheezing or rhonchi Cardiovascular  Auscultation:  Regular rate, without rubs, murmurs or gallops  Edema/varicosities:  Not grossly evident Abdominal  Soft,nontender, without masses, guarding or rebound.  Liver/spleen:  No organomegaly noted  Hernia:  None appreciated  Skin  Inspection:  Grossly normal Breasts: Examined lying and sitting. Bilateral piercing of nipples without redness    Right: Without masses, retractions, nipple discharge or axillary adenopathy.   Left: Without masses, retractions, nipple discharge or axillary adenopathy. Genitourinary   Inguinal/mons:  Normal without inguinal adenopathy  External genitalia:  Clitoral hood piercing. Normal appearing vulva with no masses, tenderness, or lesions  BUS/Urethra/Skene's glands:  Normal  Vagina:  Normal appearing with normal color and discharge, no lesions.    Cervix:  Normal appearing without discharge or lesions. 2mm nabothian cyst 11 oclock  Uterus:  Normal in size, shape and contour.  Midline and mobile, nontender  Adnexa/parametria:     Rt: Normal in size, without masses or tenderness.   Lt: Normal in size, without masses or tenderness.  Anus and perineum: Normal. 5mm non thrombosed hemorrhoid    Raynelle Fanning, CMA present for exam  Assessment/Plan:   1. Well woman exam with routine gynecological exam - mammo up to date - Labs with PCP - Cytology - PAP( [facility])    Discussed SBE, colonoscopy and DEXA screening as directed. Recommend of exercise weekly, including weight bearing exercise. Encouraged the use of seatbelts and sunscreen.  Return in 1 year for annual or sooner prn.  Arlie Solomons B WHNP-BC, 2:27 PM 08/04/2023

## 2023-08-11 MED ORDER — NITROGLYCERIN 0.4 MG SL SUBL
SUBLINGUAL_TABLET | SUBLINGUAL | Status: AC
  Filled 2023-08-11: qty 2

## 2023-08-20 ENCOUNTER — Other Ambulatory Visit: Payer: Self-pay | Admitting: Family Medicine

## 2023-10-21 ENCOUNTER — Other Ambulatory Visit: Payer: Self-pay | Admitting: Family Medicine

## 2023-10-29 ENCOUNTER — Encounter: Payer: Self-pay | Admitting: Family Medicine

## 2023-10-29 NOTE — Telephone Encounter (Signed)
Yes I would be happy to see her  

## 2023-11-01 NOTE — Telephone Encounter (Signed)
Pt notified via My Chart

## 2023-11-02 ENCOUNTER — Ambulatory Visit: Payer: 59 | Admitting: Family Medicine

## 2023-11-19 ENCOUNTER — Other Ambulatory Visit: Payer: Self-pay | Admitting: Family Medicine

## 2023-12-03 ENCOUNTER — Other Ambulatory Visit: Payer: Self-pay | Admitting: Family Medicine

## 2023-12-20 ENCOUNTER — Other Ambulatory Visit: Payer: Self-pay | Admitting: Family Medicine

## 2023-12-23 ENCOUNTER — Other Ambulatory Visit: Payer: Self-pay | Admitting: Family Medicine

## 2023-12-24 NOTE — Telephone Encounter (Signed)
Pt LOV was 06/07/23 Please advise

## 2024-02-10 ENCOUNTER — Telehealth: Payer: Self-pay | Admitting: *Deleted

## 2024-02-10 ENCOUNTER — Other Ambulatory Visit: Payer: Self-pay | Admitting: Family Medicine

## 2024-02-10 DIAGNOSIS — Z1231 Encounter for screening mammogram for malignant neoplasm of breast: Secondary | ICD-10-CM

## 2024-02-10 NOTE — Telephone Encounter (Signed)
Copied from CRM [id number]. Topic: Clinical - Request for Lab/Test Order >> Feb 10, 2024  8:14 AM [name] Nancy wrote: Reason for CRM: Patient has an upcoming physical on 08/04/24 at 8:00am and requesting for blood work that day

## 2024-02-17 ENCOUNTER — Telehealth: Payer: 59 | Admitting: Family Medicine

## 2024-02-18 NOTE — Telephone Encounter (Signed)
 Spoke with pt advised that Dr Clent Ridges will send her to the lab after her CPE visit, verbalized understanding

## 2024-02-21 ENCOUNTER — Other Ambulatory Visit: Payer: Self-pay | Admitting: Family Medicine

## 2024-02-22 ENCOUNTER — Encounter: Payer: Self-pay | Admitting: Family Medicine

## 2024-02-22 ENCOUNTER — Telehealth (INDEPENDENT_AMBULATORY_CARE_PROVIDER_SITE_OTHER): Payer: 59 | Admitting: Family Medicine

## 2024-02-22 ENCOUNTER — Other Ambulatory Visit: Payer: 59

## 2024-02-22 DIAGNOSIS — G4700 Insomnia, unspecified: Secondary | ICD-10-CM | POA: Diagnosis not present

## 2024-02-22 DIAGNOSIS — F418 Other specified anxiety disorders: Secondary | ICD-10-CM | POA: Diagnosis not present

## 2024-02-22 DIAGNOSIS — Z77018 Contact with and (suspected) exposure to other hazardous metals: Secondary | ICD-10-CM | POA: Diagnosis not present

## 2024-02-22 DIAGNOSIS — M791 Myalgia, unspecified site: Secondary | ICD-10-CM | POA: Diagnosis not present

## 2024-02-22 MED ORDER — DULOXETINE HCL 30 MG PO CPEP: ORAL_CAPSULE | ORAL | 0 refills | Status: DC

## 2024-02-22 NOTE — Progress Notes (Signed)
 Subjective:    Patient ID: Stacy Hays, female    DOB: June 13, 1971, 53 y.o.   MRN: [id number]  HPI Virtual Visit via Video Note  I connected with the patient on 02/22/24 at  9:15 AM EST by a video enabled telemedicine application and verified that I am speaking with the correct person using two identifiers.  Location patient: home Location provider:work or home office Persons participating in the virtual visit: patient, provider  I discussed the limitations of evaluation and management by telemedicine and the availability of in person appointments. The patient expressed understanding and agreed to proceed.   HPI: Here for several issues. First she has been working on self improvement measures for the past 6 months by eating a healthy diet, exercising, doing yoga, and meditation. She has lost 24 lbs and she feels better. She has weaned herself off Zolpidem as well as Tramadol. She asks what she can try that is "natural" for sleep, and she asks how to come off Wellbutrin and Cymbalta. In addition, she wants to be tested for heavy metals. [place] and she says some of her former neighbors are having health issues.    ROS: See pertinent positives and negatives per HPI.  Past Medical History:  Diagnosis Date   Arthritis    COVID 06/2021   Depression    Dysfunctional uterine bleeding    Family history of breast cancer    Headache(784.0)    Heart murmur    Herniated disc    Insomnia    Migraine without aura    Overactive bladder    Plantar fasciitis     Past Surgical History:  Procedure Laterality Date   ABDOMINOPLASTY  2006   BREAST BIOPSY Right 2013   BREAST LUMPECTOMY WITH RADIOACTIVE SEED LOCALIZATION Right 11/26/2021   Procedure: RIGHT BREAST LUMPECTOMY WITH RADIOACTIVE SEED LOCALIZATION X2;  Surgeon: Manus Rudd, MD;  Location: [facility];  Service: General;   Laterality: Right;   COLONOSCOPY  12/24/2021   per Dr. Loreta Ave, clear, repeat in 10 yrs   CYSTOCELE REPAIR     with endometrial ablation   ENDOMETRIAL ABLATION  2010   HEMATOMA EVACUATION Right 11/27/2021   Procedure: EVACUATION HEMATOMA RIGHT BREAST;  Surgeon: Chevis Pretty III, MD;  Location: [facility];  Service: General;  Laterality: Right;  60 MINUTES ROOM 4   LEEP  1993   had 2 in the same year    LUMBAR DISC SURGERY  2007    Family History  Problem Relation Age of Onset   Diabetes Mother 98   Hyperlipidemia Mother 9   Hypertension Mother    Breast cancer Mother 59   Heart disease Mother    Hypertension Father    Cervical cancer Maternal Aunt    Mental retardation Maternal Aunt    Cerebral palsy Maternal Aunt    Breast cancer Maternal Grandmother 27   Heart disease Maternal Grandfather 66   Birth defects Paternal Grandmother    Breast cancer Paternal Grandmother    Heart disease Paternal Grandfather    Diabetes Paternal Grandfather    Stroke Paternal Grandfather    Breast cancer Cousin        dx in her 45s, mat first cousin   Breast cancer Cousin        mat first cousin   Kidney disease Other    Prostate cancer Other  Sudden death Other    Coronary artery disease Other      Current Outpatient Medications:    acetaminophen (TYLENOL) 500 MG tablet, Take 500-1,000 mg by mouth every 6 (six) hours as needed (for pain)., Disp: , Rfl:    buPROPion (WELLBUTRIN XL) 150 MG 24 hr tablet, TAKE 1 TABLET(150 MG) BY MOUTH DAILY, Disp: 90 tablet, Rfl: 3   Cholecalciferol (VITAMIN D3) 125 MCG (5000 UT) CAPS, Take 5,000 Units by mouth daily with breakfast., Disp: , Rfl:    cyclobenzaprine (FLEXERIL) 10 MG tablet, Take 1 tablet (10 mg total) by mouth at bedtime., Disp: 15 tablet, Rfl: 0   DULoxetine (CYMBALTA) 60 MG capsule, TAKE 1 CAPSULE(60 MG) BY MOUTH DAILY, Disp: 90 capsule, Rfl: 0   ibuprofen (ADVIL) 200 MG tablet, Take 800 mg by mouth every 6 (six) hours as needed for mild pain (or  headaches or migraines)., Disp: , Rfl:    Misc Natural Products (GLUCOS-CHONDROIT-MSM COMPLEX PO), Take 1 tablet by mouth daily., Disp: , Rfl:    Multiple Vitamin (MULTIVITAMIN) tablet, Take 1 tablet by mouth daily., Disp: , Rfl:    naproxen (NAPROSYN) 500 MG tablet, Take 1 tablet (500 mg total) by mouth 2 (two) times daily. (Patient taking differently: Take 500 mg by mouth as needed.), Disp: 30 tablet, Rfl: 0   ondansetron (ZOFRAN-ODT) 8 MG disintegrating tablet, DISSOLVE 1 TABLET ON THE  TONGUE EVERY 8 HOURS AS  NEEDED FOR NAUSEA AND  VOMITING (Patient taking differently: Take 8 mg by mouth every 8 (eight) hours as needed for nausea or vomiting (related to migraines and dissolve orally).), Disp: 120 tablet, Rfl: 0   phentermine 37.5 MG capsule, TAKE 1 CAPSULE(37.5 MG) BY MOUTH EVERY MORNING, Disp: 90 capsule, Rfl: 1   SUMAtriptan (IMITREX) 100 MG tablet, TAKE 1 TABLET BY MOUTH AS NEEDED FOR HEADACHE, Disp: 10 tablet, Rfl: 11   tiZANidine (ZANAFLEX) 4 MG capsule, Take 1 capsule (4 mg total) by mouth 3 (three) times daily as needed for muscle spasms., Disp: 60 capsule, Rfl: 2   traMADol (ULTRAM) 50 MG tablet, Take 2 tablets (100 mg total) by mouth every 6 (six) hours as needed., Disp: 120 tablet, Rfl: 2   valACYclovir (VALTREX) 500 MG tablet, Take 1 tablet (500 mg total) by mouth 2 (two) times daily as needed (fever blisters)., Disp: 60 tablet, Rfl: 5   zolpidem (AMBIEN) 10 MG tablet, TAKE 1 TABLET(10 MG) BY MOUTH AT BEDTIME AS NEEDED FOR SLEEP, Disp: 30 tablet, Rfl: 5  EXAM:  VITALS per patient if applicable:  GENERAL: alert, oriented, appears well and in no acute distress  HEENT: atraumatic, conjunttiva clear, no obvious abnormalities on inspection of external nose and ears  NECK: normal movements of the head and neck  LUNGS: on inspection no signs of respiratory distress, breathing rate appears normal, no obvious gross SOB, gasping or wheezing  CV: no obvious cyanosis  MS: moves all  visible extremities without noticeable abnormality  PSYCH/NEURO: pleasant and cooperative, no obvious depression or anxiety, speech and thought processing grossly intact  ASSESSMENT AND PLAN: For insomnia, she has stopped Zolpidem so I suggested she try Melatonin 5-10 mg at bedtime. She has stopped Tramadol. For depression and anxiety, we will wean her off Cymbalta. She will take 30 mg a day for 2 weeks, then take this every other day for 2 weeks, then stop. She will stay on Wellbutrin for the time being. We will have a follow up visit in 4 weeks. Also we will set  her up for a urine heavy metals test.  Gershon Crane, MD  Discussed the following assessment and plan:  No diagnosis found.     I discussed the assessment and treatment plan with the patient. The patient was provided an opportunity to ask questions and all were answered. The patient agreed with the plan and demonstrated an understanding of the instructions.   The patient was advised to call back or seek an in-person evaluation if the symptoms worsen or if the condition fails to improve as anticipated.      Review of Systems     Objective:   Physical Exam        Assessment & Plan:

## 2024-02-28 ENCOUNTER — Encounter: Payer: Self-pay | Admitting: *Deleted

## 2024-02-28 LAB — HEAVY METALS PROFILE, URINE
Arsenic, 24H Ur: <10 ug/L (ref <=80)
Lead, 24 hr urine: <10 ug/L (ref <80)
Mercury, 24H Ur: <4 ug/L (ref <=20)

## 2024-03-21 ENCOUNTER — Ambulatory Visit: Payer: 59 | Admitting: Family Medicine

## 2024-03-28 ENCOUNTER — Telehealth (INDEPENDENT_AMBULATORY_CARE_PROVIDER_SITE_OTHER): Admitting: Family Medicine

## 2024-03-28 ENCOUNTER — Encounter: Payer: Self-pay | Admitting: Family Medicine

## 2024-03-28 DIAGNOSIS — G4700 Insomnia, unspecified: Secondary | ICD-10-CM | POA: Diagnosis not present

## 2024-03-28 DIAGNOSIS — F418 Other specified anxiety disorders: Secondary | ICD-10-CM | POA: Diagnosis not present

## 2024-03-28 NOTE — Progress Notes (Signed)
 Subjective:    Patient ID: Stacy Hays, female    DOB: Apr 29, 1971, 53 y.o.   MRN: [id number]  HPI Virtual Visit via Video Note  I connected with the patient on 03/28/24 at 10:30 AM EDT by a video enabled telemedicine application and verified that I am speaking with the correct person using two identifiers.  Location patient: home Location provider:work or home office Persons participating in the virtual visit: patient, provider  I discussed the limitations of evaluation and management by telemedicine and the availability of in person appointments. The patient expressed understanding and agreed to proceed.   HPI: Here to follow up on her moods and her sleep. Since our last visit, she has tapered herself totally off Cymbalta, and she has stopped taking melatonin. She is taking magnesium for sleep. She feels great, and her moods are good. She continues to eat a healthy diet and she is exercising regularly.    ROS: See pertinent positives and negatives per HPI.  Past Medical History:  Diagnosis Date   Arthritis    COVID 06/2021   Depression    Dysfunctional uterine bleeding    Family history of breast cancer    Headache(784.0)    Heart murmur    Herniated disc    Insomnia    Migraine without aura    Overactive bladder    Plantar fasciitis     Past Surgical History:  Procedure Laterality Date   ABDOMINOPLASTY  2006   BREAST BIOPSY Right 2013   BREAST LUMPECTOMY WITH RADIOACTIVE SEED LOCALIZATION Right 11/26/2021   Procedure: RIGHT BREAST LUMPECTOMY WITH RADIOACTIVE SEED LOCALIZATION X2;  Surgeon: Manus Rudd, MD;  Location: [facility];  Service: General;  Laterality: Right;   COLONOSCOPY  12/24/2021   per Dr. Loreta Ave, clear, repeat in 10 yrs   CYSTOCELE REPAIR     with endometrial ablation   ENDOMETRIAL ABLATION  2010   HEMATOMA EVACUATION Right 11/27/2021   Procedure: EVACUATION HEMATOMA RIGHT BREAST;  Surgeon: Chevis Pretty III, MD;  Location: [facility];   Service: General;  Laterality: Right;  60 MINUTES ROOM 4   LEEP  1993   had 2 in the same year    LUMBAR DISC SURGERY  2007    Family History  Problem Relation Age of Onset   Diabetes Mother 32   Hyperlipidemia Mother 75   Hypertension Mother    Breast cancer Mother 84   Heart disease Mother    Hypertension Father    Cervical cancer Maternal Aunt    Mental retardation Maternal Aunt    Cerebral palsy Maternal Aunt    Breast cancer Maternal Grandmother 69   Heart disease Maternal Grandfather 85   Birth defects Paternal Grandmother    Breast cancer Paternal Grandmother    Heart disease Paternal Grandfather    Diabetes Paternal Grandfather    Stroke Paternal Grandfather    Breast cancer Cousin        dx in her 17s, mat first cousin   Breast cancer Cousin        mat first cousin   Kidney disease Other    Prostate cancer Other    Sudden death Other    Coronary artery disease Other      Current Outpatient Medications:    acetaminophen (TYLENOL) 500 MG tablet, Take 500-1,000 mg by mouth every 6 (six) hours as needed (for pain)., Disp: , Rfl:    buPROPion (WELLBUTRIN XL) 150 MG 24 hr tablet, TAKE 1 TABLET(150 MG)  BY MOUTH DAILY, Disp: 90 tablet, Rfl: 3   Cholecalciferol (VITAMIN D3) 125 MCG (5000 UT) CAPS, Take 5,000 Units by mouth daily with breakfast., Disp: , Rfl:    cyclobenzaprine (FLEXERIL) 10 MG tablet, Take 1 tablet (10 mg total) by mouth at bedtime., Disp: 15 tablet, Rfl: 0   ibuprofen (ADVIL) 200 MG tablet, Take 800 mg by mouth every 6 (six) hours as needed for mild pain (or headaches or migraines)., Disp: , Rfl:    Misc Natural Products (GLUCOS-CHONDROIT-MSM COMPLEX PO), Take 1 tablet by mouth daily., Disp: , Rfl:    Multiple Vitamin (MULTIVITAMIN) tablet, Take 1 tablet by mouth daily., Disp: , Rfl:    naproxen (NAPROSYN) 500 MG tablet, Take 1 tablet (500 mg total) by mouth 2 (two) times daily. (Patient taking differently: Take 500 mg by mouth as needed.), Disp: 30  tablet, Rfl: 0   ondansetron (ZOFRAN-ODT) 8 MG disintegrating tablet, DISSOLVE 1 TABLET ON THE  TONGUE EVERY 8 HOURS AS  NEEDED FOR NAUSEA AND  VOMITING (Patient taking differently: Take 8 mg by mouth every 8 (eight) hours as needed for nausea or vomiting (related to migraines and dissolve orally).), Disp: 120 tablet, Rfl: 0   phentermine 37.5 MG capsule, TAKE 1 CAPSULE(37.5 MG) BY MOUTH EVERY MORNING, Disp: 90 capsule, Rfl: 1   SUMAtriptan (IMITREX) 100 MG tablet, TAKE 1 TABLET BY MOUTH AS NEEDED FOR HEADACHE, Disp: 10 tablet, Rfl: 11   tiZANidine (ZANAFLEX) 4 MG capsule, Take 1 capsule (4 mg total) by mouth 3 (three) times daily as needed for muscle spasms., Disp: 60 capsule, Rfl: 2   valACYclovir (VALTREX) 500 MG tablet, Take 1 tablet (500 mg total) by mouth 2 (two) times daily as needed (fever blisters)., Disp: 60 tablet, Rfl: 5   DULoxetine (CYMBALTA) 30 MG capsule, Take once daily for 2 weeks, then take once every other day for 2 weeks, then stop (Patient not taking: Reported on 03/28/2024), Disp: 21 capsule, Rfl: 0  EXAM:  VITALS per patient if applicable:  GENERAL: alert, oriented, appears well and in no acute distress  HEENT: atraumatic, conjunttiva clear, no obvious abnormalities on inspection of external nose and ears  NECK: normal movements of the head and neck  LUNGS: on inspection no signs of respiratory distress, breathing rate appears normal, no obvious gross SOB, gasping or wheezing  CV: no obvious cyanosis  MS: moves all visible extremities without noticeable abnormality  PSYCH/NEURO: pleasant and cooperative, no obvious depression or anxiety, speech and thought processing grossly intact  ASSESSMENT AND PLAN: Her depression with anxiety and her insomnia are doing much better. She will now taper herself off the Wellbutrin also. Follow up as needed. Gershon Crane, MD  Discussed the following assessment and plan:  No diagnosis found.     I discussed the assessment and  treatment plan with the patient. The patient was provided an opportunity to ask questions and all were answered. The patient agreed with the plan and demonstrated an understanding of the instructions.   The patient was advised to call back or seek an in-person evaluation if the symptoms worsen or if the condition fails to improve as anticipated.      Review of Systems     Objective:   Physical Exam        Assessment & Plan:

## 2024-04-28 ENCOUNTER — Ambulatory Visit
Admission: RE | Admit: 2024-04-28 | Discharge: 2024-04-28 | Disposition: A | Discharge status: Home / Self Care | Payer: 59 | Source: Ambulatory Visit | Attending: Family Medicine | Admitting: Family Medicine

## 2024-04-28 DIAGNOSIS — Z1231 Encounter for screening mammogram for malignant neoplasm of breast: Secondary | ICD-10-CM

## 2024-06-09 ENCOUNTER — Ambulatory Visit (INDEPENDENT_AMBULATORY_CARE_PROVIDER_SITE_OTHER): Payer: 59 | Admitting: Family Medicine

## 2024-06-09 ENCOUNTER — Encounter: Payer: Self-pay | Admitting: Family Medicine

## 2024-06-09 VITALS — BP 98/64 | HR 81 | Temp 98.1°F | Ht 66.25 in | Wt 182.0 lb

## 2024-06-09 DIAGNOSIS — Z0000 Encounter for general adult medical examination without abnormal findings: Secondary | ICD-10-CM

## 2024-06-09 NOTE — Progress Notes (Signed)
   Subjective:    Patient ID: Stacy Hays, female    DOB: 1971/10/12, 53 y.o.   MRN: [id number]  HPI Here for a well exam. She feels great. She has been watching her diet and exercising, and she has lost 33 lbs in the past year.    Review of Systems  Constitutional: Negative.   HENT: Negative.    Eyes: Negative.   Respiratory: Negative.    Cardiovascular: Negative.   Gastrointestinal: Negative.   Genitourinary:  Negative for decreased urine volume, difficulty urinating, dyspareunia, dysuria, enuresis, flank pain, frequency, hematuria, pelvic pain and urgency.  Musculoskeletal: Negative.   Skin: Negative.   Neurological: Negative.  Negative for headaches.  Psychiatric/Behavioral: Negative.         Objective:   Physical Exam Constitutional:      General: She is not in acute distress.    Appearance: Normal appearance. She is well-developed.  HENT:     Head: Normocephalic and atraumatic.     Right Ear: External ear normal.     Left Ear: External ear normal.     Nose: Nose normal.     Mouth/Throat:     Pharynx: No oropharyngeal exudate.   Eyes:     General: No scleral icterus.    Conjunctiva/sclera: Conjunctivae normal.     Pupils: Pupils are equal, round, and reactive to light.   Neck:     Thyroid : No thyromegaly.     Vascular: No JVD.   Cardiovascular:     Rate and Rhythm: Normal rate and regular rhythm.     Pulses: Normal pulses.     Heart sounds: Normal heart sounds. No murmur heard.    No friction rub. No gallop.  Pulmonary:     Effort: Pulmonary effort is normal. No respiratory distress.     Breath sounds: Normal breath sounds. No wheezing or rales.  Chest:     Chest wall: No tenderness.  Abdominal:     General: Bowel sounds are normal. There is no distension.     Palpations: Abdomen is soft. There is no mass.     Tenderness: There is no abdominal tenderness. There is no guarding or rebound.   Musculoskeletal:        General: No tenderness. Normal range  of motion.     Cervical back: Normal range of motion and neck supple.  Lymphadenopathy:     Cervical: No cervical adenopathy.   Skin:    General: Skin is warm and dry.     Findings: No erythema or rash.   Neurological:     General: No focal deficit present.     Mental Status: She is alert and oriented to person, place, and time.     Cranial Nerves: No cranial nerve deficit.     Motor: No abnormal muscle tone.     Coordination: Coordination normal.     Deep Tendon Reflexes: Reflexes are normal and symmetric. Reflexes normal.   Psychiatric:        Mood and Affect: Mood normal.        Behavior: Behavior normal.        Thought Content: Thought content normal.        Judgment: Judgment normal.           Assessment & Plan:  Well exam. We discussed diet and exercise. Get fasting labs. Corita Diego, MD

## 2024-06-10 LAB — HEPATIC FUNCTION PANEL
AG Ratio: 1.9 (calc) (ref 1.0–2.5)
ALT: 13 U/L (ref 6–29)
AST: 18 U/L (ref 10–35)
Albumin: 4.6 g/dL (ref 3.6–5.1)
Alkaline phosphatase (APISO): 77 U/L (ref 37–153)
Bilirubin, Direct: 0.1 mg/dL (ref 0.0–0.2)
Globulin: 2.4 g/dL (ref 1.9–3.7)
Indirect Bilirubin: 0.5 mg/dL (ref 0.2–1.2)
Total Bilirubin: 0.6 mg/dL (ref 0.2–1.2)
Total Protein: 7 g/dL (ref 6.1–8.1)

## 2024-06-10 LAB — CBC WITH DIFFERENTIAL/PLATELET
Absolute Lymphocytes: 2599 {cells}/uL (ref 850–3900)
Absolute Monocytes: 336 {cells}/uL (ref 200–950)
Basophils Absolute: 61 {cells}/uL (ref 0–200)
Basophils Relative: 1 %
Eosinophils Absolute: 171 {cells}/uL (ref 15–500)
Eosinophils Relative: 2.8 %
HCT: 45.9 % — ABNORMAL HIGH (ref 35.0–45.0)
Hemoglobin: 14.9 g/dL (ref 11.7–15.5)
MCH: 29.4 pg (ref 27.0–33.0)
MCHC: 32.5 g/dL (ref 32.0–36.0)
MCV: 90.7 fL (ref 80.0–100.0)
MPV: 9.9 fL (ref 7.5–12.5)
Monocytes Relative: 5.5 %
Neutro Abs: 2934 {cells}/uL (ref 1500–7800)
Neutrophils Relative %: 48.1 %
Platelets: 360 10*3/uL (ref 140–400)
RBC: 5.06 10*6/uL (ref 3.80–5.10)
RDW: 12.7 % (ref 11.0–15.0)
Total Lymphocyte: 42.6 %
WBC: 6.1 10*3/uL (ref 3.8–10.8)

## 2024-06-10 LAB — BASIC METABOLIC PANEL WITH GFR
BUN: 18 mg/dL (ref 7–25)
CO2: 27 mmol/L (ref 20–32)
Calcium: 9.4 mg/dL (ref 8.6–10.4)
Chloride: 103 mmol/L (ref 98–110)
Creat: 0.9 mg/dL (ref 0.50–1.03)
Glucose, Bld: 72 mg/dL (ref 65–99)
Potassium: 4.9 mmol/L (ref 3.5–5.3)
Sodium: 140 mmol/L (ref 135–146)
eGFR: 76 mL/min/{1.73_m2} (ref >=60)

## 2024-06-10 LAB — LIPID PANEL
Cholesterol: 216 mg/dL — ABNORMAL HIGH (ref <200)
HDL: 70 mg/dL (ref >=50)
LDL Cholesterol (Calc): 129 mg/dL — ABNORMAL HIGH
Non-HDL Cholesterol (Calc): 146 mg/dL — ABNORMAL HIGH (ref <130)
Total CHOL/HDL Ratio: 3.1 (calc) (ref <5.0)
Triglycerides: 73 mg/dL (ref <150)

## 2024-06-10 LAB — HEMOGLOBIN A1C
Hgb A1c MFr Bld: 5.1 % (ref <5.7)
Mean Plasma Glucose: 100 mg/dL
eAG (mmol/L): 5.5 mmol/L

## 2024-06-10 LAB — TSH: TSH: 2.19 m[IU]/L

## 2024-06-12 ENCOUNTER — Ambulatory Visit: Payer: Self-pay | Admitting: Family Medicine

## 2024-06-28 ENCOUNTER — Encounter: Payer: Self-pay | Admitting: Family Medicine

## 2024-07-03 ENCOUNTER — Telehealth: Payer: Self-pay

## 2024-07-03 ENCOUNTER — Other Ambulatory Visit (HOSPITAL_COMMUNITY): Payer: Self-pay

## 2024-07-03 MED ORDER — TIRZEPATIDE 5 MG/0.5ML ~~LOC~~ SOAJ
5.0000 mg | SUBCUTANEOUS | 5 refills | Status: DC
Start: 2024-07-03 — End: 2024-07-04

## 2024-07-03 NOTE — Telephone Encounter (Signed)
 Done

## 2024-07-03 NOTE — Telephone Encounter (Signed)
 Ozempic/Mounjaro  is approved exclusively as an adjunct to diet and exercise to improve glycemic control in adults with TYPE 2 DIABETES MELLITUS. A review of patient's medical chart reveals no  documented diagnosis of type 2 diabetes or an A1C 6.5 OR GREATER indicative of diabetes. Therefore, they do not  currently meet the criteria for prior authorization of this medication. If clinically appropriate, alternative  options such as Saxenda, Zepbound , or Georjean may be considered for this patient.

## 2024-07-04 MED ORDER — TIRZEPATIDE-WEIGHT MANAGEMENT 2.5 MG/0.5ML ~~LOC~~ SOLN
2.5000 mg | SUBCUTANEOUS | 5 refills | Status: DC
Start: 2024-07-04 — End: 2024-07-11

## 2024-07-04 NOTE — Telephone Encounter (Signed)
 I cancelled the Mounjaro  and instead I sent in Zepbound  to try

## 2024-07-06 ENCOUNTER — Other Ambulatory Visit (HOSPITAL_COMMUNITY): Payer: Self-pay

## 2024-07-10 NOTE — Telephone Encounter (Signed)
 Rx for Mounjaro  is needed for PA, please advise

## 2024-07-10 NOTE — Telephone Encounter (Signed)
Message sent to Dr Clent Ridges for approval

## 2024-07-11 MED ORDER — TIRZEPATIDE 5 MG/0.5ML ~~LOC~~ SOAJ: 5.0000 mg | SUBCUTANEOUS | 5 refills | Status: AC

## 2024-07-11 NOTE — Telephone Encounter (Signed)
 I cancelled Zepbound  and ordered the Mounjaro 

## 2024-07-11 NOTE — Addendum Note (Signed)
 Addended by: JOHNNY SENIOR A on: 07/11/2024 09:52 AM   Modules accepted: Orders

## 2024-07-12 ENCOUNTER — Telehealth: Payer: Self-pay

## 2024-07-12 ENCOUNTER — Other Ambulatory Visit (HOSPITAL_COMMUNITY): Payer: Self-pay

## 2024-07-12 ENCOUNTER — Telehealth: Payer: Self-pay | Admitting: Family Medicine

## 2024-07-12 NOTE — Telephone Encounter (Signed)
 It looks like her best option is to apply for the company program and pay $500 a month

## 2024-07-12 NOTE — Telephone Encounter (Signed)
 Highest A1C is 5.3 Ran test claim for Zepbound  and [employer] will only cover for OSA not weight loss.  Ozempic/Mounjaro  is approved exclusively as an adjunct to diet and exercise to improve glycemic  control in adults with type 2 diabetes mellitus. A review of patient's medical chart reveals no  documented diagnosis of type 2 diabetes or an A1C indicative of diabetes. Therefore, they do not  currently meet the criteria for prior authorization of this medication. If clinically appropriate, alternative  options such as Saxenda, Zepbound , or Georjean may be considered for this patient.

## 2024-07-12 NOTE — Telephone Encounter (Unsigned)
 Copied from CRM [id number]. Topic: Referral - Prior Authorization Question >> Jul 12, 2024 10:15 AM [name] wrote: Reason for CRM: Patient is calling to advise that the Mounjaro  needs a PA in order to be covered by her insurance, she is requesting that Dr. Johnny can submit a PA so that the [facility] pharmacy can fill this prescription.

## 2024-07-13 NOTE — Telephone Encounter (Signed)
 Please sed a PA for Mounjaro 

## 2024-07-14 ENCOUNTER — Encounter: Payer: Self-pay | Admitting: Advanced Practice Midwife

## 2024-07-14 NOTE — Telephone Encounter (Signed)
 Pt notified via My Chart

## 2024-07-14 NOTE — Telephone Encounter (Signed)
 Dr Johnny changed Mounjaro  to Zepbound , please send a PA to pt pan for coverage

## 2024-08-04 ENCOUNTER — Encounter: Payer: 59 | Admitting: Family Medicine
